# Patient Record
Sex: Female | Born: 1975 | Race: White | Hispanic: No | Smoking: Current every day smoker
Health system: Southern US, Community
[De-identification: ages and names within clinical notes are randomized; demographics above are authoritative.]

## PROBLEM LIST (undated history)

## (undated) DIAGNOSIS — I10 Essential (primary) hypertension: Secondary | ICD-10-CM

## (undated) DIAGNOSIS — G8929 Other chronic pain: Secondary | ICD-10-CM

## (undated) DIAGNOSIS — R519 Headache, unspecified: Secondary | ICD-10-CM

## (undated) DIAGNOSIS — M25569 Pain in unspecified knee: Secondary | ICD-10-CM

## (undated) DIAGNOSIS — R51 Headache: Secondary | ICD-10-CM

## (undated) DIAGNOSIS — J45909 Unspecified asthma, uncomplicated: Secondary | ICD-10-CM

---

## 2004-08-25 ENCOUNTER — Emergency Department: Payer: Self-pay | Admitting: General Practice

## 2004-12-28 ENCOUNTER — Emergency Department: Payer: Self-pay | Admitting: Emergency Medicine

## 2005-03-18 ENCOUNTER — Ambulatory Visit: Payer: Self-pay | Admitting: Nurse Practitioner

## 2005-06-26 ENCOUNTER — Emergency Department: Payer: Self-pay | Admitting: Emergency Medicine

## 2005-09-29 ENCOUNTER — Emergency Department: Payer: Self-pay | Admitting: Emergency Medicine

## 2007-01-31 ENCOUNTER — Emergency Department: Payer: Self-pay | Admitting: Emergency Medicine

## 2007-04-17 ENCOUNTER — Emergency Department: Payer: Self-pay | Admitting: Emergency Medicine

## 2007-09-03 ENCOUNTER — Emergency Department: Payer: Self-pay | Admitting: Internal Medicine

## 2007-11-04 ENCOUNTER — Emergency Department: Payer: Self-pay | Admitting: Emergency Medicine

## 2008-04-30 ENCOUNTER — Inpatient Hospital Stay: Payer: Self-pay | Admitting: Internal Medicine

## 2008-05-04 ENCOUNTER — Inpatient Hospital Stay: Payer: Self-pay | Admitting: Internal Medicine

## 2009-04-01 ENCOUNTER — Emergency Department: Payer: Self-pay | Admitting: Unknown Physician Specialty

## 2009-07-19 ENCOUNTER — Emergency Department: Payer: Self-pay | Admitting: Emergency Medicine

## 2009-10-11 ENCOUNTER — Emergency Department: Payer: Self-pay | Admitting: Internal Medicine

## 2010-01-22 ENCOUNTER — Emergency Department (HOSPITAL_COMMUNITY)
Admission: EM | Admit: 2010-01-22 | Discharge: 2010-01-23 | Payer: Self-pay | Source: Home / Self Care | Admitting: Emergency Medicine

## 2010-04-10 ENCOUNTER — Emergency Department (HOSPITAL_COMMUNITY)
Admission: EM | Admit: 2010-04-10 | Discharge: 2010-04-10 | Disposition: A | Discharge status: Home / Self Care | Payer: Self-pay | Attending: Emergency Medicine | Admitting: Emergency Medicine

## 2010-04-10 DIAGNOSIS — K219 Gastro-esophageal reflux disease without esophagitis: Secondary | ICD-10-CM | POA: Insufficient documentation

## 2010-04-10 DIAGNOSIS — N61 Mastitis without abscess: Secondary | ICD-10-CM | POA: Insufficient documentation

## 2010-04-20 LAB — URINALYSIS, ROUTINE W REFLEX MICROSCOPIC
Bilirubin Urine: NEGATIVE
Glucose, UA: NEGATIVE mg/dL
Ketones, ur: NEGATIVE mg/dL
Nitrite: NEGATIVE
Specific Gravity, Urine: 1.005 (ref 1.005–1.030)
pH: 5.5 (ref 5.0–8.0)

## 2010-10-29 ENCOUNTER — Emergency Department (HOSPITAL_COMMUNITY)
Admission: EM | Admit: 2010-10-29 | Discharge: 2010-10-29 | Disposition: A | Discharge status: Home / Self Care | Payer: Self-pay | Attending: Emergency Medicine | Admitting: Emergency Medicine

## 2010-10-29 DIAGNOSIS — M546 Pain in thoracic spine: Secondary | ICD-10-CM | POA: Insufficient documentation

## 2010-10-29 DIAGNOSIS — M545 Low back pain, unspecified: Secondary | ICD-10-CM | POA: Insufficient documentation

## 2010-10-29 DIAGNOSIS — K219 Gastro-esophageal reflux disease without esophagitis: Secondary | ICD-10-CM | POA: Insufficient documentation

## 2011-08-22 ENCOUNTER — Emergency Department: Payer: Self-pay | Admitting: Emergency Medicine

## 2012-06-03 ENCOUNTER — Emergency Department: Payer: Self-pay | Admitting: Emergency Medicine

## 2013-02-20 ENCOUNTER — Emergency Department: Payer: Self-pay | Admitting: Emergency Medicine

## 2013-04-06 ENCOUNTER — Emergency Department: Payer: Self-pay | Admitting: Emergency Medicine

## 2013-09-21 ENCOUNTER — Ambulatory Visit: Payer: Self-pay | Admitting: Physician Assistant

## 2013-11-16 ENCOUNTER — Emergency Department: Payer: Self-pay | Admitting: Emergency Medicine

## 2013-11-16 LAB — CBC WITH DIFFERENTIAL/PLATELET
BASOS PCT: 0.2 %
Basophil #: 0 10*3/uL (ref 0.0–0.1)
EOS PCT: 0 %
Eosinophil #: 0 10*3/uL (ref 0.0–0.7)
HCT: 39.1 % (ref 35.0–47.0)
HGB: 12.4 g/dL (ref 12.0–16.0)
LYMPHS PCT: 13.6 %
Lymphocyte #: 1.4 10*3/uL (ref 1.0–3.6)
MCH: 24.9 pg — ABNORMAL LOW (ref 26.0–34.0)
MCHC: 31.7 g/dL — AB (ref 32.0–36.0)
MCV: 79 fL — AB (ref 80–100)
MONO ABS: 0.7 x10 3/mm (ref 0.2–0.9)
Monocyte %: 6.6 %
Neutrophil #: 8.4 10*3/uL — ABNORMAL HIGH (ref 1.4–6.5)
Neutrophil %: 79.6 %
Platelet: 271 10*3/uL (ref 150–440)
RBC: 4.98 10*6/uL (ref 3.80–5.20)
RDW: 17.1 % — AB (ref 11.5–14.5)
WBC: 10.6 10*3/uL (ref 3.6–11.0)

## 2013-11-16 LAB — BASIC METABOLIC PANEL
Anion Gap: 6 — ABNORMAL LOW (ref 7–16)
BUN: 9 mg/dL (ref 7–18)
CHLORIDE: 105 mmol/L (ref 98–107)
Calcium, Total: 8.6 mg/dL (ref 8.5–10.1)
Co2: 29 mmol/L (ref 21–32)
Creatinine: 0.65 mg/dL (ref 0.60–1.30)
EGFR (Non-African Amer.): >60
Glucose: 106 mg/dL — ABNORMAL HIGH (ref 65–99)
Osmolality: 279 (ref 275–301)
Potassium: 3.8 mmol/L (ref 3.5–5.1)
Sodium: 140 mmol/L (ref 136–145)

## 2013-11-16 LAB — URINALYSIS, COMPLETE
BACTERIA: NONE SEEN
BLOOD: NEGATIVE
Bilirubin,UR: NEGATIVE
Glucose,UR: NEGATIVE mg/dL (ref 0–75)
Ketone: NEGATIVE
LEUKOCYTE ESTERASE: NEGATIVE
Nitrite: NEGATIVE
PH: 6 (ref 4.5–8.0)
PROTEIN: NEGATIVE
RBC,UR: <1 /HPF (ref 0–5)
SPECIFIC GRAVITY: 1.005 (ref 1.003–1.030)
WBC UR: 1 /HPF (ref 0–5)

## 2013-11-16 LAB — TROPONIN I: Troponin-I: <0.02 ng/mL

## 2014-01-25 ENCOUNTER — Emergency Department (HOSPITAL_COMMUNITY)
Admission: EM | Admit: 2014-01-25 | Discharge: 2014-01-25 | Disposition: A | Discharge status: Home / Self Care | Payer: Medicaid Other | Attending: Emergency Medicine | Admitting: Emergency Medicine

## 2014-01-25 ENCOUNTER — Encounter (HOSPITAL_COMMUNITY): Payer: Self-pay | Admitting: Cardiology

## 2014-01-25 DIAGNOSIS — Z880 Allergy status to penicillin: Secondary | ICD-10-CM | POA: Diagnosis not present

## 2014-01-25 DIAGNOSIS — I10 Essential (primary) hypertension: Secondary | ICD-10-CM | POA: Insufficient documentation

## 2014-01-25 DIAGNOSIS — R51 Headache: Secondary | ICD-10-CM | POA: Insufficient documentation

## 2014-01-25 DIAGNOSIS — R112 Nausea with vomiting, unspecified: Secondary | ICD-10-CM | POA: Insufficient documentation

## 2014-01-25 DIAGNOSIS — J45909 Unspecified asthma, uncomplicated: Secondary | ICD-10-CM | POA: Diagnosis not present

## 2014-01-25 DIAGNOSIS — Z79899 Other long term (current) drug therapy: Secondary | ICD-10-CM | POA: Diagnosis not present

## 2014-01-25 DIAGNOSIS — Z791 Long term (current) use of non-steroidal anti-inflammatories (NSAID): Secondary | ICD-10-CM | POA: Diagnosis not present

## 2014-01-25 DIAGNOSIS — R519 Headache, unspecified: Secondary | ICD-10-CM

## 2014-01-25 DIAGNOSIS — Z720 Tobacco use: Secondary | ICD-10-CM | POA: Insufficient documentation

## 2014-01-25 HISTORY — DX: Pain in unspecified knee: M25.569

## 2014-01-25 HISTORY — DX: Headache, unspecified: R51.9

## 2014-01-25 HISTORY — DX: Essential (primary) hypertension: I10

## 2014-01-25 HISTORY — DX: Other chronic pain: G89.29

## 2014-01-25 HISTORY — DX: Unspecified asthma, uncomplicated: J45.909

## 2014-01-25 HISTORY — DX: Headache: R51

## 2014-01-25 MED ORDER — PROMETHAZINE HCL 25 MG/ML IJ SOLN
25.0000 mg | Freq: Once | INTRAMUSCULAR | Status: AC
  Administered 2014-01-25: 25 mg via INTRAMUSCULAR
  Filled 2014-01-25: qty 1

## 2014-01-25 MED ORDER — KETOROLAC TROMETHAMINE 60 MG/2ML IM SOLN
60.0000 mg | Freq: Once | INTRAMUSCULAR | Status: AC
  Administered 2014-01-25: 60 mg via INTRAMUSCULAR
  Filled 2014-01-25: qty 2

## 2014-01-25 MED ORDER — PROMETHAZINE HCL 25 MG PO TABS: 25.0000 mg | ORAL_TABLET | Freq: Four times a day (QID) | ORAL | Status: DC | PRN

## 2014-01-25 MED ORDER — DIPHENHYDRAMINE HCL 50 MG/ML IJ SOLN
50.0000 mg | Freq: Once | INTRAMUSCULAR | Status: AC
  Administered 2014-01-25: 50 mg via INTRAMUSCULAR
  Filled 2014-01-25: qty 1

## 2014-01-25 NOTE — ED Notes (Signed)
Pt alert & oriented x4, stable gait. Patient given discharge instructions, paperwork & prescription(s). Patient  instructed to stop at the registration desk to finish any additional paperwork. Patient verbalized understanding. Pt left department w/ no further questions. 

## 2014-01-25 NOTE — ED Notes (Signed)
Headache and vomiting since yesterday

## 2014-01-25 NOTE — Discharge Instructions (Signed)
°Emergency Department Resource Guide °1) Find a Doctor and Pay Out of Pocket °Although you won't have to find out who is covered by your insurance plan, it is a good idea to ask around and get recommendations. You will then need to call the office and see if the doctor you have chosen will accept you as a new patient and what types of options they offer for patients who are self-pay. Some doctors offer discounts or will set up payment plans for their patients who do not have insurance, but you will need to ask so you aren't surprised when you get to your appointment. ° °2) Contact Your Local Health Department °Not all health departments have doctors that can see patients for sick visits, but many do, so it is worth a call to see if yours does. If you don't know where your local health department is, you can check in your phone book. The CDC also has a tool to help you locate your state's health department, and many state websites also have listings of all of their local health departments. ° °3) Find a Walk-in Clinic °If your illness is not likely to be very severe or complicated, you may want to try a walk in clinic. These are popping up all over the country in pharmacies, drugstores, and shopping centers. They're usually staffed by nurse practitioners or physician assistants that have been trained to treat common illnesses and complaints. They're usually fairly quick and inexpensive. However, if you have serious medical issues or chronic medical problems, these are probably not your best option. ° °No Primary Care Doctor: °- Call [facility] at  832-8000 - they can help you locate a primary care doctor that  accepts your insurance, provides certain services, etc. °- Physician Referral Service- 1-800-533-3463 ° °Chronic Pain Problems: °Organization         Address  Phone   Notes  °[facility]  (336) 297-2271 Patients need to be referred by their primary care doctor.  ° °Medication  Assistance: °Organization         Address  Phone   Notes  °[place] [facility] [address] °[place], [place] [address] (336) 641-8030 --Must be a resident of [place] °-- Must have NO insurance coverage whatsoever (no Medicaid/ Medicare, etc.) °-- The pt. MUST have a primary care doctor that directs their care regularly and follows them in the community °  °[facility]  (866) 331-1348   °[employer]  (888) 892-1162   ° °Agencies that provide inexpensive medical care: °Organization         Address  Phone   Notes  °[facility]  (336) 832-8035   °[facility]    (336) 832-7272   °[facility] [address] °[place], [place] [address] (336) 832-4777   °[facility] [address], °[place] (336) 271-4999   °[facility]    (336) 373-0678   °[facility]    (336) 272-1050   °[facility] ° [address], [place] Phone:  (336) 832-4444, Fax:  (336) 832-4440 Hours of Operation:  9 am - 6 pm, M-F.  Also accepts Medicaid/Medicare and self-pay.  °[facility] ° [address], [place] Phone: (336) 832-3150, Fax: (336) 832-3151. Hours of Operation:  8:30 am - 5:30 pm, M-F.  Also accepts Medicaid and self-pay.  °[facility] [place] [address], [place] Phone: (336) 878-6027   °[facility] [address], [place], [place] (336)723-1848, Ext. 123 Mondays & Thursdays: 7-9 AM.  First 15 patients are seen on a first come, first serve basis. °  ° °Medicaid-accepting [place] Providers: ° °Organization         Address  Phone   Notes  °[facility] [address], [place] (336) 641-2100 Also accepts self-pay patients.  °[facility] [address], [place] ° (336) 856-9996   °[facility] [address], [place]  (336) 288-8857   °[facility] [address] [place] [address], [place] (336) 299-7000   °Veita Bland [address], [place]  ° (336) 373-1557 Only accepts [place] Access Medicaid patients after they have their name applied to their card.  ° °Self-Pay (no insurance) in [place]: ° °Organization         Address  Phone   Notes  °Sickle Cell Patients, [facility] [address], [place] (336) 832-1970   °[facility] [address], [place] (336) 832-4400   °[facility] ° [address], [place] (336) 992-4800   °[facility]/Dr. Osei-Bonsu ° [address] [place] [address], [place] or [address], [place] (336) 841-8500 Phone number for both [place] and [place] locations is the same.  °[facility] [address], [place] (336) 299-0000   °[facility] [address] [place] [address], [place] or [address] (336) 852-7530 °(336) 878-2260   °[facility] [address], [place] (336) 350-1642, phone; (336) 294-5005, fax Sees patients 1st and 3rd Saturday of every month.  Must not qualify for public or private insurance (i.e. Medicaid, Medicare, [place] Health Choice, Veterans' Benefits) • Household income should be no more than 200% of the poverty level •The clinic cannot treat you if you are pregnant or think you are pregnant • Sexually transmitted diseases are not treated at the clinic.  ° ° °Dental Care: °Organization         Address  Phone  Notes  °[place] [facility] [address], [place] (336) 641-6152 Accepts children up to age 21 who are enrolled in Medicaid or [place] Health Choice; pregnant women with a Medicaid card; and children who have applied for Medicaid or [place] Health Choice, but were declined, whose parents can pay a reduced fee at time of service.  °[place]  [facility] [place]  [address], [place] (336) 641-7733 Accepts children up to age 21 who are enrolled in Medicaid or [place] Health Choice; pregnant women with a Medicaid card; and children who have applied for Medicaid or [place] Health Choice, but were declined, whose parents can pay a reduced fee at time of service.  °[facility] ° [address], [place] (336) 641-4533 Patients are seen by appointment only. Walk-ins are not accepted. [facility] will see patients 18 years of age and older. °Monday - Tuesday (8am-5pm) °Most Wednesdays (8:30-5pm) °$30 per visit, cash only  °[facility] ° [address], [place] (336) 641-4533 Patients are seen by appointment only. Walk-ins are not accepted. [facility] will see patients 18 years of age and older. °One   Wednesday Evening (Monthly: Volunteer Based).  $30 per visit, cash only  °[facility]  (919) 537-3737 for adults; Children under age 4, call Graduate Pediatric Dentistry at ([phone]) [phone]. Children aged 4-14, please call (919) [phone] to request a pediatric application. ° Dental services are provided in all areas of dental care including fillings, crowns and bridges, complete and partial dentures, implants, gum treatment, root canals, and extractions. Preventive care is also provided. Treatment is provided to both adults and children. °Patients are selected via a lottery and there is often a waiting list. °  °[facility] [address], °[place] ° (336) 763-8833 [email or web address] °  °[facility] [address], [place], [place] (336)723-1848, Ext. 123 Second and Fourth Thursday of each month, opens at 6:30 AM; Clinic ends at 9 AM.  Patients are seen on a first-come first-served basis, and a limited number are seen during each clinic.  ° °[facility] ° [address], [place], [place] (336) 723-7904    Eligibility Requirements °You must have lived in [place], [place], or [place] counties for at least the last three months. °  You cannot be eligible for state or federal sponsored healthcare insurance, including Veterans Administration, Medicaid, or Medicare. °  You generally cannot be eligible for healthcare insurance through your employer.  °  How to apply: °Eligibility screenings are held every Tuesday and Wednesday afternoon from 1:00 pm until 4:00 pm. You do not need an appointment for the interview!  °[facility] [address], [place], [place] 336-631-2330   °[facility]  336-342-8273   °[facility]  336-703-3100   °[facility]  336-570-6415   ° °Behavioral Health Resources in the Community: °Intensive Outpatient Programs °Organization         Address  Phone  Notes  °[facility] [address], [place], [place] 336-878-6098   °[facility] [address], [place], [place] 336-832-9800   °[facility] [address], [place], [place] ° 336-882-2125   °[facility] [address],  °[place], [place] 1-800-853-5163 or 336-641-4981   °Substance Abuse Resources °Organization         Address  Phone  Notes  °[facility]  336-882-2125   °[facility]  336-784-9470   °[facility]  336-285-9073   °[facility]  336-845-3988   °Residential & Outpatient Substance Abuse Program  1-800-659-3381   °Psychological Services °Organization         Address  Phone  Notes  °[facility]  336- 832-9600   °[facility]  336- 378-7881   °[facility] [address], [place] 1-800-853-5163 or 336-641-4981   ° °Mobile Crisis Teams °Organization         Address  Phone  Notes  °[facility], Mobile Crisis Care Unit  1-877-626-1772   °Assertive °[facility] ° [address]  [place], [place] 336-834-9664   °Sharon DeEsch [address] °[place] 336-554-5454   ° °Self-Help/Support Groups °Organization         Address  Phone             Notes  °[facility]  - variety of support groups  336- 373-1402 Call for more information  °Narcotics Anonymous (NA), [facility] [address], °[place]  2 meetings at this location  ° °  Residential Treatment Programs Organization         Address  Phone  Notes  [facility] [address],    [place]  [phone]   [facility]  [address], [place] [phone], [place], [place] [phone]) [phone]   [facility] [address], [place] [phone] Admissions: 8am-3pm M-F  [facility] [address]    [place], [place] [phone]   [facility] [address], [place], [place] [phone])[phone]   [facility] [address]  [place], [place] [phone]   [facility] Intensive Outpatient [address], [place], [place] [phone]   [facility] [address]  [place], [place] [phone]) [phone] or (718)[phone](480)[phone]   [facility] [address] [place], [place] [phone] Accepts Medicaid  [facility] [address]  [place] [phone] Substance Abuse/Addiction Treatment   [facility]         Address  Phone  Notes  [facility]  (410)577-8878(888) (732) [phone]   Angie FavaJulie Brannon, PhD [address], [address] [place], [place]   ([phone](336) [phone] or (516)[phone](336) ([phone]   [facility]   [address] [place], [place] [phone](336) [phone]   [facility] [address], [place], [place] ([phone](336) ([phone]) [phone] Insurance/Medicaid/sponsorship through [facility]  [facility] [address]                                    [place], [place] [phone](336) ([phone] Therapy/tele-psych/case    [facility] [address].   [place], [place] (765) [phone](336) [phone]    Dr. Lolly MustacheArfeen  [phone](336) [phone]   [facility]  [employer] [facility] 1) [address], [place] 2) [address], [place] 3)  [address], [place] [phone](336) [phone](336) [phone]  [phone](336) [phone]   [facility] Child Abuse Hotline 720-188-4860(336) (541) 078-6064 or [phone](336) [phone] (After Hours)      Take over the counter tylenol, ibuprofen and benadryl, as directed on packaging, with the prescription given to you today, as needed for headache.  Keep a headache diary, as discussed.  Call your regular medical doctor today to schedule a follow up appointment within the next 2 days.  Return to the Emergency Department immediately sooner if worsening.

## 2014-01-25 NOTE — ED Provider Notes (Signed)
CSN: [id number]     Arrival date & time 01/25/14  1236 History   First MD Initiated Contact with Patient 01/25/14 1309     Chief Complaint  Patient presents with  . Headache      HPI Pt was seen at 1315. Per pt, c/o gradual onset and persistence of constant acute flair of her chronic migraine headache for the past 2 days.Has been associated with nasal/sinus congestion and several episodes of N/V.   Describes the headache as per her usual chronic migraine headache pain pattern for several years.  Denies headache was sudden or maximal in onset or at any time.  Denies visual changes, no focal motor weakness, no tingling/numbness in extremities, no fevers, no neck pain, no rash.     Past Medical History  Diagnosis Date  . Asthma   . Hypertension   . Chronic headache   . Chronic knee pain    Past Surgical History  Procedure Laterality Date  . Cesarean section      History  Substance Use Topics  . Smoking status: Current Every Day Smoker  . Smokeless tobacco: Not on file  . Alcohol Use: No    Review of Systems ROS: Statement: All systems negative except as marked or noted in the HPI; Constitutional: Negative for fever and chills. ; ; Eyes: Negative for eye pain, redness and discharge. ; ; ENMT: Negative for ear pain, hoarseness, sore throat. +nasal congestion, sinus pressure. ; ; Cardiovascular: Negative for chest pain, palpitations, diaphoresis, dyspnea and peripheral edema. ; ; Respiratory: Negative for cough, wheezing and stridor. ; ; Gastrointestinal: +N/V. Negative for diarrhea, abdominal pain, blood in stool, hematemesis, jaundice and rectal bleeding. . ; ; Genitourinary: Negative for dysuria, flank pain and hematuria. ; ; Musculoskeletal: Negative for back pain and neck pain. Negative for swelling and trauma.; ; Skin: Negative for pruritus, rash, abrasions, blisters, bruising and skin lesion.; ; Neuro: +headache. Negative for lightheadedness and neck stiffness. Negative for  weakness, altered level of consciousness , altered mental status, extremity weakness, paresthesias, involuntary movement, seizure and syncope.     Allergies  Penicillins  Home Medications   Prior to Admission medications   Medication Sig Start Date End Date Taking? Authorizing Provider  citalopram (CELEXA) 10 MG tablet Take 10 mg by mouth daily.   Yes Historical Provider, MD  ferrous sulfate 325 (65 FE) MG tablet Take 325 mg by mouth daily.   Yes Historical Provider, MD  meloxicam (MOBIC) 7.5 MG tablet Take 7.5 mg by mouth daily.   Yes Historical Provider, MD  omeprazole (PRILOSEC) 20 MG capsule Take 20 mg by mouth daily.   Yes Historical Provider, MD  traMADol (ULTRAM) 50 MG tablet Take 50 mg by mouth every 6 (six) hours as needed for moderate pain.    Yes Historical Provider, MD   BP 151/100 mmHg  Pulse 90  Temp(Src) 98.1 F (36.7 C) (Oral)  Resp 18  Ht 5\' 3"  (1.6 m)  Wt 250 lb (113.399 kg)  BMI 44.30 kg/m2  SpO2 100%  LMP 01/05/2014 Physical Exam  1320: Physical examination:  Nursing notes reviewed; Vital signs and O2 SAT reviewed;  Constitutional: Well developed, Well nourished, Well hydrated, In no acute distress; Head:  Normocephalic, atraumatic; Eyes: EOMI, PERRL, No scleral icterus; ENMT: TM's clear bilat. +edemetous nasal turbinates bilat with clear rhinorrhea. +tender to percuss frontal and bilat maxillary sinuses. Mouth and pharynx without lesions. No tonsillar exudates. No intra-oral edema. No submandibular or sublingual edema. No hoarse voice, no  drooling, no stridor. No pain with manipulation of larynx. No trismus. Mouth and pharynx normal, Mucous membranes moist; Neck: Supple, Full range of motion, No lymphadenopathy; Cardiovascular: Regular rate and rhythm, No murmur, rub, or gallop; Respiratory: Breath sounds clear & equal bilaterally, No rales, rhonchi, wheezes.  Speaking full sentences with ease, Normal respiratory effort/excursion; Chest: Nontender, Movement normal;  Abdomen: Soft, Nontender, Nondistended, Normal bowel sounds; Genitourinary: No CVA tenderness; Extremities: Pulses normal, No tenderness, No edema, No calf edema or asymmetry.; Neuro: AA&Ox3, Major CN grossly intact.  Speech clear. No gross focal motor or sensory deficits in extremities.; Skin: Color normal, Warm, Dry.   ED Course  Procedures     MDM  MDM Reviewed: previous chart, nursing note and vitals      1520:  Pt feels better after meds and wants to go home now. Dx d/w pt and family.  Questions answered.  Verb understanding, agreeable to d/c home with outpt f/u.     Samuel JesterKathleen Taron Mondor, DO 01/28/14 1450

## 2014-01-25 NOTE — ED Notes (Signed)
Pt states she feels better and is ready for discharge

## 2014-07-10 ENCOUNTER — Emergency Department: Payer: Medicaid Other

## 2014-07-10 ENCOUNTER — Encounter: Payer: Self-pay | Admitting: Emergency Medicine

## 2014-07-10 ENCOUNTER — Emergency Department
Admission: EM | Admit: 2014-07-10 | Discharge: 2014-07-10 | Disposition: A | Discharge status: Home / Self Care | Payer: Medicaid Other | Attending: Emergency Medicine | Admitting: Emergency Medicine

## 2014-07-10 DIAGNOSIS — G43919 Migraine, unspecified, intractable, without status migrainosus: Secondary | ICD-10-CM

## 2014-07-10 DIAGNOSIS — Z720 Tobacco use: Secondary | ICD-10-CM | POA: Diagnosis not present

## 2014-07-10 DIAGNOSIS — Z880 Allergy status to penicillin: Secondary | ICD-10-CM | POA: Insufficient documentation

## 2014-07-10 DIAGNOSIS — Z792 Long term (current) use of antibiotics: Secondary | ICD-10-CM | POA: Diagnosis not present

## 2014-07-10 DIAGNOSIS — Z79899 Other long term (current) drug therapy: Secondary | ICD-10-CM | POA: Diagnosis not present

## 2014-07-10 DIAGNOSIS — R51 Headache: Secondary | ICD-10-CM | POA: Diagnosis present

## 2014-07-10 DIAGNOSIS — G43019 Migraine without aura, intractable, without status migrainosus: Secondary | ICD-10-CM | POA: Insufficient documentation

## 2014-07-10 DIAGNOSIS — I10 Essential (primary) hypertension: Secondary | ICD-10-CM | POA: Diagnosis not present

## 2014-07-10 MED ORDER — KETOROLAC TROMETHAMINE 30 MG/ML IJ SOLN
INTRAMUSCULAR | Status: AC
  Filled 2014-07-10: qty 1

## 2014-07-10 MED ORDER — DIPHENHYDRAMINE HCL 50 MG/ML IJ SOLN
25.0000 mg | Freq: Once | INTRAMUSCULAR | Status: AC
  Administered 2014-07-10: 25 mg via INTRAVENOUS

## 2014-07-10 MED ORDER — DIPHENHYDRAMINE HCL 50 MG/ML IJ SOLN
INTRAMUSCULAR | Status: AC
  Filled 2014-07-10: qty 1

## 2014-07-10 MED ORDER — METOCLOPRAMIDE HCL 5 MG/ML IJ SOLN
INTRAMUSCULAR | Status: AC
  Filled 2014-07-10: qty 2

## 2014-07-10 MED ORDER — SODIUM CHLORIDE 0.9 % IV BOLUS (SEPSIS)
1000.0000 mL | Freq: Once | INTRAVENOUS | Status: AC
  Administered 2014-07-10: 1000 mL via INTRAVENOUS

## 2014-07-10 MED ORDER — ISOMETHEPTENE-DICHLORAL-APAP 65-100-325 MG PO CAPS: 1.0000 | ORAL_CAPSULE | ORAL | Status: DC | PRN

## 2014-07-10 MED ORDER — KETOROLAC TROMETHAMINE 30 MG/ML IJ SOLN
30.0000 mg | Freq: Once | INTRAMUSCULAR | Status: AC
  Administered 2014-07-10: 30 mg via INTRAVENOUS

## 2014-07-10 MED ORDER — ONDANSETRON 4 MG PO TBDP
4.0000 mg | ORAL_TABLET | Freq: Once | ORAL | Status: AC
  Administered 2014-07-10: 4 mg via ORAL

## 2014-07-10 MED ORDER — HYDROMORPHONE HCL 1 MG/ML IJ SOLN
INTRAMUSCULAR | Status: AC
  Filled 2014-07-10: qty 1

## 2014-07-10 MED ORDER — HYDROMORPHONE HCL 1 MG/ML IJ SOLN
1.0000 mg | Freq: Once | INTRAMUSCULAR | Status: AC
  Administered 2014-07-10: 1 mg via INTRAVENOUS

## 2014-07-10 MED ORDER — METOCLOPRAMIDE HCL 5 MG/ML IJ SOLN
10.0000 mg | Freq: Once | INTRAMUSCULAR | Status: AC
  Administered 2014-07-10: 10 mg via INTRAVENOUS

## 2014-07-10 MED ORDER — ONDANSETRON 4 MG PO TBDP
ORAL_TABLET | ORAL | Status: AC
  Filled 2014-07-10: qty 1

## 2014-07-10 NOTE — ED Notes (Signed)
States she developed migraine last pm  No relief with home meds

## 2014-07-10 NOTE — ED Provider Notes (Signed)
[facility] Emergency Department Provider Note  ____________________________________________  Time seen: 1507  I have reviewed the triage vital signs and the nursing notes.   HISTORY  Chief Complaint Migraine   HPI Deanna Vazquez is a 39 y.o. female is here today with complaint of headaches. She states that this headache started yesterday. She is currently taking Topamax which she states is not helping she has been to her PCP and medication has been adjusted without relief of her headache. She states she has a headache every day. Her PCP was going to get a CT scan since she has not had one in a long time. Positive for nausea and vomiting. The last time being in the triage room. Patient is photophobic she states this time the pain is severe and everything is making her pain worse. None of her current medications are helping with her headache.At time of exam patient's pain is 9 out of 10.   Past Medical History  Diagnosis Date  . Asthma   . Hypertension   . Chronic headache   . Chronic knee pain     There are no active problems to display for this patient.   Past Surgical History  Procedure Laterality Date  . Cesarean section      Current Outpatient Rx  Name  Route  Sig  Dispense  Refill  . citalopram (CELEXA) 10 MG tablet   Oral   Take 10 mg by mouth daily.         . ferrous sulfate 325 (65 FE) MG tablet   Oral   Take 325 mg by mouth daily.         Marland Kitchen. isometheptene-acetaminophen-dichloralphenazone (MIDRIN) 65-100-325 MG capsule   Oral   Take 1 capsule by mouth every 4 (four) hours as needed for migraine. Maximum 5 capsules in 12 hours for migraine headaches, 8 capsules in 24 hours for tension headaches.   20 capsule   2   . meloxicam (MOBIC) 7.5 MG tablet   Oral   Take 7.5 mg by mouth daily.         Marland Kitchen. omeprazole (PRILOSEC) 20 MG capsule   Oral   Take 20 mg by mouth daily.         . promethazine (PHENERGAN) 25 MG tablet    Oral   Take 1 tablet (25 mg total) by mouth every 6 (six) hours as needed for nausea or vomiting (or headache).   8 tablet   0   . traMADol (ULTRAM) 50 MG tablet   Oral   Take 50 mg by mouth every 6 (six) hours as needed for moderate pain.            Allergies Penicillins  No family history on file.  Social History History  Substance Use Topics  . Smoking status: Current Every Day Smoker  . Smokeless tobacco: Not on file  . Alcohol Use: No    Review of Systems Constitutional: No fever/chills Eyes: Positive photophobia ENT: No sore throat. Cardiovascular: Denies chest pain. Respiratory: Denies shortness of breath. Gastrointestinal: No abdominal pain. Positive nausea, positive vomiting.  No diarrhea.  No constipation. Genitourinary: Negative for dysuria. Musculoskeletal: Negative for back pain. Skin: Negative for rash. Neurological: Positive for chronic migraine headaches, no focal weakness or numbness.  10-point ROS otherwise negative.  ____________________________________________   PHYSICAL EXAM:  VITAL SIGNS: ED Triage Vitals  Enc Vitals Group     BP 07/10/14 1426 180/110 mmHg     Pulse Rate 07/10/14  1426 96     Resp 07/10/14 1426 20     Temp 07/10/14 1426 98 F (36.7 C)     Temp Source 07/10/14 1426 Oral     SpO2 07/10/14 1426 98 %     Weight --      Height --      Head Cir --      Peak Flow --      Pain Score 07/10/14 1426 9     Pain Loc --      Pain Edu? --      Excl. in GC? --     Constitutional: Alert and oriented. Well appearing and in no acute distress. Patient crying Eyes: Conjunctivae are normal. PERRL. EOMI. photophobic  funduscopic not done due to patient's uncooperative Head: Atraumatic. Nose: No congestion/rhinnorhea. Neck: No stridor.  Supple Hematological/Lymphatic/Immunilogical: No cervical lymphadenopathy. Cardiovascular: Normal rate, regular rhythm. Grossly normal heart sounds.  Good peripheral circulation. Respiratory: Normal  respiratory effort.  No retractions. Lungs CTAB. Gastrointestinal: Soft and nontender. No distention. No abdominal bruits. No CVA tenderness. Musculoskeletal: No lower extremity tenderness nor edema.  No joint effusions. Reflexes are 1+ bilaterally lying down. Upper extremity range of motion without difficulty. Neurologic:  Normal speech and language. No gross focal neurologic deficits are appreciated. Speech is normal. No gait instability. Cranial nerves II through XII grossly intact Skin:  Skin is warm, dry and intact. No rash noted. Psychiatric: Mood and affect are normal. Speech and behavior are normal.  ____________________________________________   LABS (all labs ordered are listed, but only abnormal results are displayed)  Labs Reviewed  POC URINE PREG, ED   RADIOLOGY  CT scan of the head per radiologist negative ____________________________________________   PROCEDURES  Procedure(s) performed: None  Critical Care performed: No  ____________________________________________   INITIAL IMPRESSION / ASSESSMENT AND PLAN / ED COURSE  Pertinent labs & imaging results that were available during my care of the patient were reviewed by me and considered in my medical decision making (see chart for details).  Patient was completely headache free prior to leaving the emergency room. She is very frustrated with the fact that her Topamax is not helping prevent her headaches. She has been taking 1 pain pill every hour for severe headache per her family doctor within a results. She was given a prescription for Midrin to try for headaches and follow up with her PCP. ____________________________________________   FINAL CLINICAL IMPRESSION(S) / ED DIAGNOSES  Final diagnoses:  Intractable migraine, unspecified migraine type      Tommi Rumps, PA-C 07/10/14 1836

## 2014-07-10 NOTE — ED Notes (Signed)
Pt states that she has had chronic headaches for months. She states she has been treated with medications and oxygen. She states the pain this time is  severe and everything is making the pain worse . She also has nausea with the headache.

## 2014-07-10 NOTE — ED Notes (Signed)
PT states that she has

## 2014-07-10 NOTE — Discharge Instructions (Signed)

## 2014-09-03 ENCOUNTER — Emergency Department
Admission: EM | Admit: 2014-09-03 | Discharge: 2014-09-03 | Disposition: A | Discharge status: Home / Self Care | Payer: Medicaid Other | Attending: Emergency Medicine | Admitting: Emergency Medicine

## 2014-09-03 ENCOUNTER — Encounter: Payer: Self-pay | Admitting: Student

## 2014-09-03 DIAGNOSIS — Z720 Tobacco use: Secondary | ICD-10-CM | POA: Insufficient documentation

## 2014-09-03 DIAGNOSIS — R51 Headache: Secondary | ICD-10-CM | POA: Diagnosis present

## 2014-09-03 DIAGNOSIS — Z8669 Personal history of other diseases of the nervous system and sense organs: Secondary | ICD-10-CM

## 2014-09-03 DIAGNOSIS — Z791 Long term (current) use of non-steroidal anti-inflammatories (NSAID): Secondary | ICD-10-CM | POA: Diagnosis not present

## 2014-09-03 DIAGNOSIS — G43909 Migraine, unspecified, not intractable, without status migrainosus: Secondary | ICD-10-CM | POA: Diagnosis not present

## 2014-09-03 DIAGNOSIS — G43009 Migraine without aura, not intractable, without status migrainosus: Secondary | ICD-10-CM

## 2014-09-03 DIAGNOSIS — Z79899 Other long term (current) drug therapy: Secondary | ICD-10-CM | POA: Insufficient documentation

## 2014-09-03 DIAGNOSIS — I10 Essential (primary) hypertension: Secondary | ICD-10-CM | POA: Diagnosis not present

## 2014-09-03 DIAGNOSIS — Z880 Allergy status to penicillin: Secondary | ICD-10-CM | POA: Diagnosis not present

## 2014-09-03 MED ORDER — MEPERIDINE HCL 25 MG/ML IJ SOLN
25.0000 mg | Freq: Once | INTRAMUSCULAR | Status: AC
  Administered 2014-09-03: 25 mg via INTRAMUSCULAR
  Filled 2014-09-03: qty 1

## 2014-09-03 MED ORDER — PROMETHAZINE HCL 25 MG/ML IJ SOLN
12.5000 mg | Freq: Once | INTRAMUSCULAR | Status: AC
  Administered 2014-09-03: 12.5 mg via INTRAMUSCULAR
  Filled 2014-09-03: qty 1

## 2014-09-03 MED ORDER — BUTALBITAL-APAP-CAFFEINE 50-325-40 MG PO TABS: 1.0000 | ORAL_TABLET | Freq: Four times a day (QID) | ORAL | Status: AC | PRN

## 2014-09-03 MED ORDER — MEPERIDINE HCL 50 MG/ML IJ SOLN
50.0000 mg | Freq: Once | INTRAMUSCULAR | Status: AC
  Administered 2014-09-03: 50 mg via INTRAMUSCULAR
  Filled 2014-09-03: qty 1

## 2014-09-03 NOTE — ED Notes (Signed)
Pt reports headache started last night.  Reports history of migraines, takes Topamax and Effexor to treat.  Reports nausea, denies vomiting. Eyes sensitive to light.

## 2014-09-03 NOTE — Discharge Instructions (Signed)

## 2014-09-03 NOTE — ED Provider Notes (Signed)
[facility] Emergency Department Provider Note  ____________________________________________  Time seen: Approximately 7:02 PM  I have reviewed the triage vital signs and the nursing notes.   HISTORY  Chief Complaint Migraine   HPI Deanna Vazquez is a 39 y.o. female who presents for evaluation of migraine headache. Patient reports history of same and has had worse headaches. States this one started yesterday positive for nausea but denies any vomiting. Seen here about one month ago for the same.   Past Medical History  Diagnosis Date  . Asthma   . Hypertension   . Chronic headache   . Chronic knee pain     There are no active problems to display for this patient.   Past Surgical History  Procedure Laterality Date  . Cesarean section      Current Outpatient Rx  Name  Route  Sig  Dispense  Refill  . citalopram (CELEXA) 10 MG tablet   Oral   Take 10 mg by mouth daily.         . ferrous sulfate 325 (65 FE) MG tablet   Oral   Take 325 mg by mouth daily.         Marland Kitchen isometheptene-acetaminophen-dichloralphenazone (MIDRIN) 65-100-325 MG capsule   Oral   Take 1 capsule by mouth every 4 (four) hours as needed for migraine. Maximum 5 capsules in 12 hours for migraine headaches, 8 capsules in 24 hours for tension headaches.   20 capsule   2   . meloxicam (MOBIC) 7.5 MG tablet   Oral   Take 7.5 mg by mouth daily.         Marland Kitchen omeprazole (PRILOSEC) 20 MG capsule   Oral   Take 20 mg by mouth daily.         . promethazine (PHENERGAN) 25 MG tablet   Oral   Take 1 tablet (25 mg total) by mouth every 6 (six) hours as needed for nausea or vomiting (or headache).   8 tablet   0   . traMADol (ULTRAM) 50 MG tablet   Oral   Take 50 mg by mouth every 6 (six) hours as needed for moderate pain.            Allergies Penicillins  History reviewed. No pertinent family history.  Social History History  Substance Use Topics  . Smoking  status: Current Every Day Smoker -- 0.50 packs/day    Types: Cigarettes  . Smokeless tobacco: Never Used  . Alcohol Use: Yes     Comment: occasional    Review of Systems Constitutional: No fever/chills Eyes: No visual changes. ENT: No sore throat. Cardiovascular: Denies chest pain. Respiratory: Denies shortness of breath. Gastrointestinal: No abdominal pain.  Positive nausea, no vomiting.  No diarrhea.  No constipation. Genitourinary: Negative for dysuria. Musculoskeletal: Negative for back pain. Skin: Negative for rash. Neurological: Positive for headaches, negative for focal weakness or numbness.  10-point ROS otherwise negative.  ____________________________________________   PHYSICAL EXAM:  VITAL SIGNS: ED Triage Vitals  Enc Vitals Group     BP 09/03/14 1818 133/103 mmHg     Pulse Rate 09/03/14 1818 95     Resp 09/03/14 1818 18     Temp 09/03/14 1818 97.8 F (36.6 C)     Temp Source 09/03/14 1818 Oral     SpO2 09/03/14 1818 96 %     Weight 09/03/14 1818 239 lb (108.41 kg)     Height 09/03/14 1818 5\' 1"  (1.549 m)  Head Cir --      Peak Flow --      Pain Score 09/03/14 1819 10     Pain Loc --      Pain Edu? --      Excl. in GC? --     Constitutional: Alert and oriented. Well appearing and in no acute distress. Laying down in the dark with a cold washcloth over her eyes. Eyes: Conjunctivae are normal. PERRL. EOMI. Head: Atraumatic. Nose: No congestion/rhinnorhea. Mouth/Throat: Mucous membranes are moist.  Oropharynx non-erythematous. Neck: No stridor.   Cardiovascular: Normal rate, regular rhythm. Grossly normal heart sounds.  Good peripheral circulation. Respiratory: Normal respiratory effort.  No retractions. Lungs CTAB. Gastrointestinal: Soft and nontender. No distention. No abdominal bruits. No CVA tenderness. Musculoskeletal: No lower extremity tenderness nor edema.  No joint effusions. Neurologic:  Normal speech and language. No gross focal neurologic  deficits are appreciated. No gait instability. Skin:  Skin is warm, dry and intact. No rash noted. Psychiatric: Mood and affect are normal. Speech and behavior are normal.  ____________________________________________   LABS (all labs ordered are listed, but only abnormal results are displayed)  Labs Reviewed - No data to display ____________________________________________     PROCEDURES  Procedure(s) performed: None  Critical Care performed: No  ____________________________________________   INITIAL IMPRESSION / ASSESSMENT AND PLAN / ED COURSE  Pertinent labs & imaging results that were available during my care of the patient were reviewed by me and considered in my medical decision making (see chart for details).  Acute migraine headache, recurrent. Headache minimally relieved with Demerol and Phenergan while in the ER. Patient voices no other emergency medical complaints at this time and will return to the ER with any worsening symptomology. Patient to continue her current medications. Rx given for Fioricet one to 2 every 4-6 hours as needed for headache. ____________________________________________   FINAL CLINICAL IMPRESSION(S) / ED DIAGNOSES  Final diagnoses:  History of migraine headaches  Nonintractable migraine, unspecified migraine type      Evangeline Dakin, PA-C 09/03/14 2103  Sharman Cheek, MD 09/03/14 2211

## 2014-09-03 NOTE — ED Notes (Signed)
Patient with no complaints at this time. Respirations even and unlabored. Skin warm/dry. Discharge instructions reviewed with patient at this time. Patient given opportunity to voice concerns/ask questions. Patient discharged at this time and left Emergency Department with steady gait.   

## 2014-09-03 NOTE — ED Notes (Addendum)
PA spoke to pt at this time

## 2014-10-23 ENCOUNTER — Telehealth: Payer: Self-pay | Admitting: Podiatry

## 2014-10-23 NOTE — Telephone Encounter (Signed)
Pt. Left vm to schedule appt. No answer at number given

## 2014-10-28 ENCOUNTER — Emergency Department
Admission: EM | Admit: 2014-10-28 | Discharge: 2014-10-28 | Disposition: A | Discharge status: Home / Self Care | Payer: Medicaid Other | Attending: Emergency Medicine | Admitting: Emergency Medicine

## 2014-10-28 DIAGNOSIS — I10 Essential (primary) hypertension: Secondary | ICD-10-CM | POA: Diagnosis not present

## 2014-10-28 DIAGNOSIS — Z720 Tobacco use: Secondary | ICD-10-CM | POA: Diagnosis not present

## 2014-10-28 DIAGNOSIS — M79672 Pain in left foot: Secondary | ICD-10-CM | POA: Diagnosis present

## 2014-10-28 DIAGNOSIS — M722 Plantar fascial fibromatosis: Secondary | ICD-10-CM | POA: Diagnosis not present

## 2014-10-28 DIAGNOSIS — Z880 Allergy status to penicillin: Secondary | ICD-10-CM | POA: Diagnosis not present

## 2014-10-28 DIAGNOSIS — Z79899 Other long term (current) drug therapy: Secondary | ICD-10-CM | POA: Diagnosis not present

## 2014-10-28 DIAGNOSIS — G8929 Other chronic pain: Secondary | ICD-10-CM | POA: Insufficient documentation

## 2014-10-28 MED ORDER — HYDROCODONE-ACETAMINOPHEN 5-325 MG PO TABS: 1.0000 | ORAL_TABLET | ORAL | Status: DC | PRN

## 2014-10-28 MED ORDER — MELOXICAM 15 MG PO TABS: 15.0000 mg | ORAL_TABLET | Freq: Every day | ORAL | Status: DC

## 2014-10-28 MED ORDER — PREDNISONE 10 MG (21) PO TBPK: ORAL_TABLET | ORAL | Status: DC

## 2014-10-28 NOTE — Discharge Instructions (Signed)
You may try and wear night splints that can be purchased at the pharmacy. That will help with the pain in the morning.  Plantar Fasciitis Plantar fasciitis is a common condition that causes foot pain. It is soreness (inflammation) of the band of tough fibrous tissue on the bottom of the foot that runs from the heel bone (calcaneus) to the ball of the foot. The cause of this soreness may be from excessive standing, poor fitting shoes, running on hard surfaces, being overweight, having an abnormal walk, or overuse (this is common in runners) of the painful foot or feet. It is also common in aerobic exercise dancers and ballet dancers. SYMPTOMS  Most people with plantar fasciitis complain of:  Severe pain in the morning on the bottom of their foot especially when taking the first steps out of bed. This pain recedes after a few minutes of walking.  Severe pain is experienced also during walking following a long period of inactivity.  Pain is worse when walking barefoot or up stairs DIAGNOSIS   Your caregiver will diagnose this condition by examining and feeling your foot.  Special tests such as X-rays of your foot, are usually not needed. PREVENTION   Consult a sports medicine professional before beginning a new exercise program.  Walking programs offer a good workout. With walking there is a lower chance of overuse injuries common to runners. There is less impact and less jarring of the joints.  Begin all new exercise programs slowly. If problems or pain develop, decrease the amount of time or distance until you are at a comfortable level.  Wear good shoes and replace them regularly.  Stretch your foot and the heel cords at the back of the ankle (Achilles tendon) both before and after exercise.  Run or exercise on even surfaces that are not hard. For example, asphalt is better than pavement.  Do not run barefoot on hard surfaces.  If using a treadmill, vary the incline.  Do not  continue to workout if you have foot or joint problems. Seek professional help if they do not improve. HOME CARE INSTRUCTIONS   Avoid activities that cause you pain until you recover.  Use ice or cold packs on the problem or painful areas after working out.  Only take over-the-counter or prescription medicines for pain, discomfort, or fever as directed by your caregiver.  Soft shoe inserts or athletic shoes with air or gel sole cushions may be helpful.  If problems continue or become more severe, consult a sports medicine caregiver or your own health care provider. Cortisone is a potent anti-inflammatory medication that may be injected into the painful area. You can discuss this treatment with your caregiver. MAKE SURE YOU:   Understand these instructions.  Will watch your condition.  Will get help right away if you are not doing well or get worse. Document Released: 10/20/2000 Document Revised: 04/19/2011 Document Reviewed: 12/20/2007 [facility] Patient Information 2015 [place], [place]. This information is not intended to replace advice given to you by your health care provider. Make sure you discuss any questions you have with your health care provider.

## 2014-10-28 NOTE — ED Notes (Signed)
Left heel pain for last two weeks. Very painful in the AM. Called podiatrist but needed a referral. Has history of same with both feet.

## 2014-10-28 NOTE — ED Provider Notes (Signed)
[facility] Emergency Department Provider Note  ____________________________________________  Time seen: Approximately 5:18 PM  I have reviewed the triage vital signs and the nursing notes.   HISTORY  Chief Complaint Foot Pain    HPI Deanna Vazquez is a 39 y.o. female who presents to the emergency department for evaluation of left heel pain. She reports a history of heel spurs. Several years ago she saw Dr. Al Corpus who gave her injections in the foot with relief. 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 The pain is severe in the morning and if she sits for any period of time and stands back up. She has been taking ibuprofen without relief.   Past Medical History  Diagnosis Date  . Asthma   . Hypertension   . Chronic headache   . Chronic knee pain     There are no active problems to display for this patient.   Past Surgical History  Procedure Laterality Date  . Cesarean section      Current Outpatient Rx  Name  Route  Sig  Dispense  Refill  . butalbital-acetaminophen-caffeine (FIORICET) 50-325-40 MG per tablet   Oral   Take 1-2 tablets by mouth every 6 (six) hours as needed for headache.   20 tablet   0   . citalopram (CELEXA) 10 MG tablet   Oral   Take 10 mg by mouth daily.         . ferrous sulfate 325 (65 FE) MG tablet   Oral   Take 325 mg by mouth daily.         Marland Kitchen HYDROcodone-acetaminophen (NORCO/VICODIN) 5-325 MG per tablet   Oral   Take 1 tablet by mouth every 4 (four) hours as needed.   12 tablet   0   . isometheptene-acetaminophen-dichloralphenazone (MIDRIN) 65-100-325 MG capsule   Oral   Take 1 capsule by mouth every 4 (four) hours as needed for migraine. Maximum 5 capsules in 12 hours for migraine headaches, 8 capsules in 24 hours for tension headaches.   20 capsule   2   . meloxicam (MOBIC) 15  MG tablet   Oral   Take 1 tablet (15 mg total) by mouth daily.   30 tablet   2   . omeprazole (PRILOSEC) 20 MG capsule   Oral   Take 20 mg by mouth daily.         . predniSONE (STERAPRED UNI-PAK 21 TAB) 10 MG (21) TBPK tablet      Take 6 tablets on day 1 Take 5 tablets on day 2 Take 4 tablets on day 3 Take 3 tablets on day 4 Take 2 tablets on day 5 Take 1 tablet on day 6   21 tablet   0   . promethazine (PHENERGAN) 25 MG tablet   Oral   Take 1 tablet (25 mg total) by mouth every 6 (six) hours as needed for nausea or vomiting (or headache).   8 tablet   0     Allergies Penicillins  No family history on file.  Social History Social History  Substance Use Topics  . Smoking status: Current Every Day Smoker -- 0.50 packs/day    Types: Cigarettes  . Smokeless tobacco: Never Used  . Alcohol Use: Yes     Comment: occasional    Review of Systems Constitutional: No recent  illness. Eyes: No visual changes. ENT: No sore throat. Cardiovascular: Denies chest pain or palpitations. Respiratory: Denies shortness of breath. Gastrointestinal: No abdominal pain.  Genitourinary: Negative for dysuria. Musculoskeletal: Pain in left heel. Skin: Negative for rash. Neurological: Negative for headaches, focal weakness or numbness. 10-point ROS otherwise negative.  ____________________________________________   PHYSICAL EXAM:  VITAL SIGNS: T: 97.5;  HR 77; Resp: 18; BP: 148/92; SpO2: 96% ED Triage Vitals  Enc Vitals Group     BP --      Pulse --      Resp --      Temp --      Temp src --      SpO2 --      Weight --      Height --      Head Cir --      Peak Flow --      Pain Score --      Pain Loc --      Pain Edu? --      Excl. in GC? --     Constitutional: Alert and oriented. Well appearing and in no acute distress. Eyes: Conjunctivae are normal. EOMI. Head: Atraumatic. Nose: No congestion/rhinnorhea. Neck: No stridor.  Respiratory: Normal respiratory  effort.   Musculoskeletal: Tenderness on the plantar surface of the left heel that extends to the mid foot. Neurologic:  Normal speech and language. No gross focal neurologic deficits are appreciated. Speech is normal. No gait instability. Skin:  Skin is warm, dry and intact. Atraumatic. Psychiatric: Mood and affect are normal. Speech and behavior are normal.  ____________________________________________   LABS (all labs ordered are listed, but only abnormal results are displayed)  Labs Reviewed - No data to display ____________________________________________  RADIOLOGY  Not indicated ____________________________________________   PROCEDURES  Procedure(s) performed: None   ____________________________________________   INITIAL IMPRESSION / ASSESSMENT AND PLAN / ED COURSE  Pertinent labs & imaging results that were available during my care of the patient were reviewed by me and considered in my medical decision making (see chart for details).  Exam consistent with plantar fasciitis. Patient was advised to follow up with podiatrist for symptoms that are not improving over the next week. She was advised to resume wearing supportive shoes. She was also advised to purchase night sweats at the pharmacy. She was advised to return to the emergency department for symptoms that change or worsen if unable to schedule an appointment with the primary care provider or specialist. ____________________________________________   FINAL CLINICAL IMPRESSION(S) / ED DIAGNOSES  Final diagnoses:  Plantar fasciitis, left       Chinita Pester, FNP 10/28/14 1822  Jennye Moccasin, MD 10/28/14 1932

## 2014-12-09 ENCOUNTER — Encounter: Payer: Self-pay | Admitting: Emergency Medicine

## 2014-12-09 ENCOUNTER — Emergency Department
Admission: EM | Admit: 2014-12-09 | Discharge: 2014-12-09 | Disposition: A | Discharge status: Home / Self Care | Payer: Medicaid Other | Attending: Emergency Medicine | Admitting: Emergency Medicine

## 2014-12-09 DIAGNOSIS — R51 Headache: Secondary | ICD-10-CM | POA: Diagnosis present

## 2014-12-09 DIAGNOSIS — G43009 Migraine without aura, not intractable, without status migrainosus: Secondary | ICD-10-CM | POA: Diagnosis not present

## 2014-12-09 DIAGNOSIS — Z880 Allergy status to penicillin: Secondary | ICD-10-CM | POA: Diagnosis not present

## 2014-12-09 DIAGNOSIS — I10 Essential (primary) hypertension: Secondary | ICD-10-CM | POA: Insufficient documentation

## 2014-12-09 DIAGNOSIS — Z7952 Long term (current) use of systemic steroids: Secondary | ICD-10-CM | POA: Insufficient documentation

## 2014-12-09 DIAGNOSIS — Z79899 Other long term (current) drug therapy: Secondary | ICD-10-CM | POA: Diagnosis not present

## 2014-12-09 DIAGNOSIS — Z720 Tobacco use: Secondary | ICD-10-CM | POA: Diagnosis not present

## 2014-12-09 DIAGNOSIS — Z791 Long term (current) use of non-steroidal anti-inflammatories (NSAID): Secondary | ICD-10-CM | POA: Insufficient documentation

## 2014-12-09 MED ORDER — SODIUM CHLORIDE 0.9 % IV BOLUS (SEPSIS)
1000.0000 mL | Freq: Once | INTRAVENOUS | Status: AC
  Administered 2014-12-09: 1000 mL via INTRAVENOUS

## 2014-12-09 MED ORDER — PROMETHAZINE HCL 25 MG/ML IJ SOLN
25.0000 mg | Freq: Once | INTRAMUSCULAR | Status: AC
  Administered 2014-12-09: 25 mg via INTRAVENOUS
  Filled 2014-12-09: qty 1

## 2014-12-09 MED ORDER — KETOROLAC TROMETHAMINE 30 MG/ML IJ SOLN
30.0000 mg | Freq: Once | INTRAMUSCULAR | Status: AC
  Administered 2014-12-09: 30 mg via INTRAVENOUS
  Filled 2014-12-09: qty 1

## 2014-12-09 MED ORDER — DIPHENHYDRAMINE HCL 50 MG/ML IJ SOLN
50.0000 mg | Freq: Once | INTRAMUSCULAR | Status: AC
  Administered 2014-12-09: 50 mg via INTRAVENOUS
  Filled 2014-12-09: qty 1

## 2014-12-09 NOTE — ED Provider Notes (Signed)
[facility] Emergency Department Provider Note  ____________________________________________  Time seen: Approximately 9:23 AM  I have reviewed the triage vital signs and the nursing notes.   HISTORY  Chief Complaint Headache    HPI Deanna Vazquez is a 39 y.o. female who presents to the emergency department complaining of a migraine headache. She states that she has a chronic history of same and is being followed by her primary care. She has an appointment in the future for a "headache specialist." She states that creases morning to include nausea/vomiting as well as photophobia and phonophobia. Patient states that she is tried her home medications with no relief. She denies any fevers or chills, neck pain, chest pain, shortness of breath.   Past Medical History  Diagnosis Date  . Asthma   . Hypertension   . Chronic headache   . Chronic knee pain     There are no active problems to display for this patient.   Past Surgical History  Procedure Laterality Date  . Cesarean section      Current Outpatient Rx  Name  Route  Sig  Dispense  Refill  . topiramate (TOPAMAX) 100 MG tablet   Oral   Take 100 mg by mouth 2 (two) times daily.         Marland Kitchen. venlafaxine (EFFEXOR) 37.5 MG tablet   Oral   Take 37.5 mg by mouth 2 (two) times daily.         . butalbital-acetaminophen-caffeine (FIORICET) 50-325-40 MG per tablet   Oral   Take 1-2 tablets by mouth every 6 (six) hours as needed for headache.   20 tablet   0   . citalopram (CELEXA) 10 MG tablet   Oral   Take 10 mg by mouth daily.         . ferrous sulfate 325 (65 FE) MG tablet   Oral   Take 325 mg by mouth daily.         Marland Kitchen. HYDROcodone-acetaminophen (NORCO/VICODIN) 5-325 MG per tablet   Oral   Take 1 tablet by mouth every 4 (four) hours as needed.   12 tablet   0   . isometheptene-acetaminophen-dichloralphenazone (MIDRIN) 65-100-325 MG capsule   Oral   Take 1 capsule by mouth every  4 (four) hours as needed for migraine. Maximum 5 capsules in 12 hours for migraine headaches, 8 capsules in 24 hours for tension headaches.   20 capsule   2   . meloxicam (MOBIC) 15 MG tablet   Oral   Take 1 tablet (15 mg total) by mouth daily.   30 tablet   2   . omeprazole (PRILOSEC) 20 MG capsule   Oral   Take 20 mg by mouth daily.         . predniSONE (STERAPRED UNI-PAK 21 TAB) 10 MG (21) TBPK tablet      Take 6 tablets on day 1 Take 5 tablets on day 2 Take 4 tablets on day 3 Take 3 tablets on day 4 Take 2 tablets on day 5 Take 1 tablet on day 6   21 tablet   0   . promethazine (PHENERGAN) 25 MG tablet   Oral   Take 1 tablet (25 mg total) by mouth every 6 (six) hours as needed for nausea or vomiting (or headache).   8 tablet   0     Allergies Penicillins  History reviewed. No pertinent family history.  Social History Social History  Substance Use Topics  .  Smoking status: Current Every Day Smoker -- 0.50 packs/day    Types: Cigarettes  . Smokeless tobacco: Never Used  . Alcohol Use: Yes     Comment: occasional    Review of Systems Constitutional: No fever/chills Eyes: No visual changes. ENT: No sore throat. Cardiovascular: Denies chest pain. Respiratory: Denies shortness of breath. Gastrointestinal: No abdominal pain.  No nausea, no vomiting.  No diarrhea.  No constipation. Genitourinary: Negative for dysuria. Musculoskeletal: Negative for back pain. Skin: Negative for rash. Neurological: Endorses headaches, but denies focal weakness or numbness. Endorses photophobia.  10-point ROS otherwise negative.  ____________________________________________   PHYSICAL EXAM:  VITAL SIGNS: ED Triage Vitals  Enc Vitals Group     BP 12/09/14 0830 160/96 mmHg     Pulse Rate 12/09/14 0830 90     Resp 12/09/14 0830 20     Temp 12/09/14 0830 98 F (36.7 C)     Temp Source 12/09/14 0830 Oral     SpO2 12/09/14 0830 97 %     Weight 12/09/14 0826 248 lb  (112.492 kg)     Height 12/09/14 0826  (1.575 m)     Head Cir --      Peak Flow --      Pain Score 12/09/14 0826 10     Pain Loc --      Pain Edu? --      Excl. in GC? --     Constitutional: Alert and oriented. Well appearing and in mild distress. Eyes: Conjunctivae are normal. PERRL. EOMI. Head: Atraumatic. Nose: No congestion/rhinnorhea. Mouth/Throat: Mucous membranes are moist.  Oropharynx non-erythematous. Neck: No stridor.   Cardiovascular: Normal rate, regular rhythm. Grossly normal heart sounds.  Good peripheral circulation. Respiratory: Normal respiratory effort.  No retractions. Lungs CTAB. Gastrointestinal: Soft and nontender. No distention. No abdominal bruits. No CVA tenderness. Musculoskeletal: No lower extremity tenderness nor edema.  No joint effusions. Neurologic:  Normal speech and language. No gross focal neurologic deficits are appreciated. No gait instability. Radial nerves II through XII grossly intact. Skin:  Skin is warm, dry and intact. No rash noted. Psychiatric: Mood and affect are normal. Speech and behavior are normal.  ____________________________________________   LABS (all labs ordered are listed, but only abnormal results are displayed)  Labs Reviewed - No data to display ____________________________________________  EKG   ____________________________________________  RADIOLOGY   ____________________________________________   PROCEDURES  Procedure(s) performed: None  Critical Care performed: No  ____________________________________________   INITIAL IMPRESSION / ASSESSMENT AND PLAN / ED COURSE  Pertinent labs & imaging results that were available during my care of the patient were reviewed by me and considered in my medical decision making (see chart for details).  Patient's history, symptoms, physical exam are consistent with known migraine headaches. Symptomatic control was provided with IV fluids, Toradol, Phenergan, and  diphenhydramine. The patient reports good benefit from medication. Patient states that headache has fully resolved 1 provider reinterviewed her. The patient will be sent home with no new medications. Patient verbalizes understanding of diagnosis and treatment plan. I advised the patient to keep her appointment with the specialist. ____________________________________________   FINAL CLINICAL IMPRESSION(S) / ED DIAGNOSES  Final diagnoses:  Migraine without aura and without status migrainosus, not intractable      Racheal Patches, PA-C 12/09/14 1053  Darien Ramus, MD 12/09/14 1544

## 2014-12-09 NOTE — ED Notes (Signed)
Started with headache on sat. States h/a conts this am. Positive nausea/vomiting and photophobia. Last time vomiting was about 4 am. Placed on stretcher with light out.. Family at bedside

## 2014-12-09 NOTE — ED Notes (Signed)
Pt to ed with c/o headache since Saturday.  Pt states she has tried excedrine without relief.

## 2014-12-09 NOTE — Discharge Instructions (Signed)
Migraine Headache °A migraine headache is an intense, throbbing pain on one or both sides of your head. A migraine can last for 30 minutes to several hours. °CAUSES  °The exact cause of a migraine headache is not always known. However, a migraine may be caused when nerves in the brain become irritated and release chemicals that cause inflammation. This causes pain. °Certain things may also trigger migraines, such as: °· Alcohol. °· Smoking. °· Stress. °· Menstruation. °· Aged cheeses. °· Foods or drinks that contain nitrates, glutamate, aspartame, or tyramine. °· Lack of sleep. °· Chocolate. °· Caffeine. °· Hunger. °· Physical exertion. °· Fatigue. °· Medicines used to treat chest pain (nitroglycerine), birth control pills, estrogen, and some blood pressure medicines. °SIGNS AND SYMPTOMS °· Pain on one or both sides of your head. °· Pulsating or throbbing pain. °· Severe pain that prevents daily activities. °· Pain that is aggravated by any physical activity. °· Nausea, vomiting, or both. °· Dizziness. °· Pain with exposure to bright lights, loud noises, or activity. °· General sensitivity to bright lights, loud noises, or smells. °Before you get a migraine, you may get warning signs that a migraine is coming (aura). An aura may include: °· Seeing flashing lights. °· Seeing bright spots, halos, or zigzag lines. °· Having tunnel vision or blurred vision. °· Having feelings of numbness or tingling. °· Having trouble talking. °· Having muscle weakness. °DIAGNOSIS  °A migraine headache is often diagnosed based on: °· Symptoms. °· Physical exam. °· A CT scan or MRI of your head. These imaging tests cannot diagnose migraines, but they can help rule out other causes of headaches. °TREATMENT °Medicines may be given for pain and nausea. Medicines can also be given to help prevent recurrent migraines.  °HOME CARE INSTRUCTIONS °· Only take over-the-counter or prescription medicines for pain or discomfort as directed by your  health care provider. The use of long-term narcotics is not recommended. °· Lie down in a dark, quiet room when you have a migraine. °· Keep a journal to find out what may trigger your migraine headaches. For example, write down: °¨ What you eat and drink. °¨ How much sleep you get. °¨ Any change to your diet or medicines. °· Limit alcohol consumption. °· Quit smoking if you smoke. °· Get 7-9 hours of sleep, or as recommended by your health care provider. °· Limit stress. °· Keep lights dim if bright lights bother you and make your migraines worse. °SEEK IMMEDIATE MEDICAL CARE IF:  °· Your migraine becomes severe. °· You have a fever. °· You have a stiff neck. °· You have vision loss. °· You have muscular weakness or loss of muscle control. °· You start losing your balance or have trouble walking. °· You feel faint or pass out. °· You have severe symptoms that are different from your first symptoms. °MAKE SURE YOU:  °· Understand these instructions. °· Will watch your condition. °· Will get help right away if you are not doing well or get worse. °  °This information is not intended to replace advice given to you by your health care provider. Make sure you discuss any questions you have with your health care provider. °  °Document Released: 01/25/2005 Document Revised: 02/15/2014 Document Reviewed: 10/02/2012 °Elsevier Interactive Patient Education ©2016 [employer] ° °Recurrent Migraine Headache °A migraine headache is an intense, throbbing pain on one or both sides of your head. Recurrent migraines keep coming back. A migraine can last for 30 minutes to several hours. °  CAUSES  °The exact cause of a migraine headache is not always known. However, a migraine may be caused when nerves in the brain become irritated and release chemicals that cause inflammation. This causes pain. °Certain things may also trigger migraines, such as:  °· Alcohol. °· Smoking. °· Stress. °· Menstruation. °· Aged cheeses. °· Foods or  drinks that contain nitrates, glutamate, aspartame, or tyramine. °· Lack of sleep. °· Chocolate. °· Caffeine. °· Hunger. °· Physical exertion. °· Fatigue. °· Medicines used to treat chest pain (nitroglycerine), birth control pills, estrogen, and some blood pressure medicines. °SYMPTOMS  °· Pain on one or both sides of your head. °· Pulsating or throbbing pain. °· Severe pain that prevents daily activities. °· Pain that is aggravated by any physical activity. °· Nausea, vomiting, or both. °· Dizziness. °· Pain with exposure to bright lights, loud noises, or activity. °· General sensitivity to bright lights, loud noises, or smells. °Before you get a migraine, you may get warning signs that a migraine is coming (aura). An aura may include: °· Seeing flashing lights. °· Seeing bright spots, halos, or zigzag lines. °· Having tunnel vision or blurred vision. °· Having feelings of numbness or tingling. °· Having trouble talking. °· Having muscle weakness. °DIAGNOSIS  °A recurrent migraine headache is often diagnosed based on: °· Symptoms. °· Physical examination. °· A CT scan or MRI of your head. These imaging tests cannot diagnose migraines but can help rule out other causes of headaches.   °TREATMENT  °Medicines may be given for pain and nausea. Medicines can also be given to help prevent recurrent migraines. °HOME CARE INSTRUCTIONS °· Only take over-the-counter or prescription medicines for pain or discomfort as directed by your health care provider. The use of long-term narcotics is not recommended. °· Lie down in a dark, quiet room when you have a migraine. °· Keep a journal to find out what may trigger your migraine headaches. For example, write down: °¨ What you eat and drink. °¨ How much sleep you get. °¨ Any change to your diet or medicines. °· Limit alcohol consumption. °· Quit smoking if you smoke. °· Get 7-9 hours of sleep, or as recommended by your health care provider. °· Limit stress. °· Keep lights dim if  bright lights bother you and make your migraines worse. °SEEK MEDICAL CARE IF:  °· You do not get relief from the medicines given to you. °· You have a recurrence of pain. °· You have a fever. °SEEK IMMEDIATE MEDICAL CARE IF: °· Your migraine becomes severe. °· You have a stiff neck. °· You have loss of vision. °· You have muscular weakness or loss of muscle control. °· You start losing your balance or have trouble walking. °· You feel faint or pass out. °· You have severe symptoms that are different from your first symptoms. °MAKE SURE YOU:  °· Understand these instructions. °· Will watch your condition. °· Will get help right away if you are not doing well or get worse. °  °This information is not intended to replace advice given to you by your health care provider. Make sure you discuss any questions you have with your health care provider. °  °Document Released: 10/20/2000 Document Revised: 02/15/2014 Document Reviewed: 10/02/2012 °Elsevier Interactive Patient Education ©2016 [employer] ° °

## 2015-07-27 ENCOUNTER — Encounter: Payer: Self-pay | Admitting: Emergency Medicine

## 2015-07-27 ENCOUNTER — Emergency Department
Admission: EM | Admit: 2015-07-27 | Discharge: 2015-07-27 | Disposition: A | Discharge status: Home / Self Care | Payer: Medicaid Other | Attending: Emergency Medicine | Admitting: Emergency Medicine

## 2015-07-27 DIAGNOSIS — Z79899 Other long term (current) drug therapy: Secondary | ICD-10-CM | POA: Diagnosis not present

## 2015-07-27 DIAGNOSIS — J45909 Unspecified asthma, uncomplicated: Secondary | ICD-10-CM | POA: Diagnosis not present

## 2015-07-27 DIAGNOSIS — I10 Essential (primary) hypertension: Secondary | ICD-10-CM | POA: Diagnosis not present

## 2015-07-27 DIAGNOSIS — G43009 Migraine without aura, not intractable, without status migrainosus: Secondary | ICD-10-CM | POA: Diagnosis not present

## 2015-07-27 DIAGNOSIS — F17210 Nicotine dependence, cigarettes, uncomplicated: Secondary | ICD-10-CM | POA: Insufficient documentation

## 2015-07-27 MED ORDER — PROCHLORPERAZINE EDISYLATE 5 MG/ML IJ SOLN
10.0000 mg | Freq: Once | INTRAMUSCULAR | Status: AC
  Administered 2015-07-27: 10 mg via INTRAMUSCULAR
  Filled 2015-07-27: qty 2

## 2015-07-27 MED ORDER — OXYCODONE-ACETAMINOPHEN 5-325 MG PO TABS
1.0000 | ORAL_TABLET | ORAL | Status: DC | PRN
  Administered 2015-07-27: 1 via ORAL

## 2015-07-27 MED ORDER — OXYCODONE-ACETAMINOPHEN 5-325 MG PO TABS
ORAL_TABLET | ORAL | Status: AC
  Filled 2015-07-27: qty 1

## 2015-07-27 MED ORDER — DIPHENHYDRAMINE HCL 50 MG/ML IJ SOLN
50.0000 mg | Freq: Once | INTRAMUSCULAR | Status: AC
  Administered 2015-07-27: 50 mg via INTRAMUSCULAR
  Filled 2015-07-27: qty 1

## 2015-07-27 MED ORDER — KETOROLAC TROMETHAMINE 60 MG/2ML IM SOLN
60.0000 mg | Freq: Once | INTRAMUSCULAR | Status: AC
  Administered 2015-07-27: 60 mg via INTRAMUSCULAR
  Filled 2015-07-27: qty 2

## 2015-07-27 NOTE — ED Notes (Signed)
Patient stated when she "squeezed her head really hard that the pain eased off"

## 2015-07-27 NOTE — ED Provider Notes (Signed)
[facility] Emergency Department Provider Note  ____________________________________________  Time seen: Approximately 8:49 PM  I have reviewed the triage vital signs and the nursing notes.   HISTORY  Chief Complaint Migraine    HPI Deanna Vazquez is a 40 y.o. female who presents emergency department complaining of migraine headaches. Patient states that she has a long history of migraines that have been treated by her primary care provider. Patient states that she has taken many different abortive as well as preventative medications for same. Patient states that she recently tried acupuncture which resolved headaches for approximately 3 months. Patient states that she has a return of migraines. They're consistent with previous migraines with generalized headache, photo and phonophobia. Patient denies any neck pain, vision changes, chest pain, shortness of breath, nausea or vomiting, abdominal pain. Patient is tried over-the-counter medications this time with no relief. Patient states that pain as a throbbing, sharp sensation that is moderate to severe.   Past Medical History  Diagnosis Date  . Asthma   . Hypertension   . Chronic headache   . Chronic knee pain     There are no active problems to display for this patient.   Past Surgical History  Procedure Laterality Date  . Cesarean section      Current Outpatient Rx  Name  Route  Sig  Dispense  Refill  . butalbital-acetaminophen-caffeine (FIORICET) 50-325-40 MG per tablet   Oral   Take 1-2 tablets by mouth every 6 (six) hours as needed for headache.   20 tablet   0   . citalopram (CELEXA) 10 MG tablet   Oral   Take 10 mg by mouth daily.         . ferrous sulfate 325 (65 FE) MG tablet   Oral   Take 325 mg by mouth daily.         Marland Kitchen HYDROcodone-acetaminophen (NORCO/VICODIN) 5-325 MG per tablet   Oral   Take 1 tablet by mouth every 4 (four) hours as needed.   12 tablet   0   .  EXPIRED: isometheptene-acetaminophen-dichloralphenazone (MIDRIN) 65-100-325 MG capsule   Oral   Take 1 capsule by mouth every 4 (four) hours as needed for migraine. Maximum 5 capsules in 12 hours for migraine headaches, 8 capsules in 24 hours for tension headaches.   20 capsule   2   . meloxicam (MOBIC) 15 MG tablet   Oral   Take 1 tablet (15 mg total) by mouth daily.   30 tablet   2   . omeprazole (PRILOSEC) 20 MG capsule   Oral   Take 20 mg by mouth daily.         . predniSONE (STERAPRED UNI-PAK 21 TAB) 10 MG (21) TBPK tablet      Take 6 tablets on day 1 Take 5 tablets on day 2 Take 4 tablets on day 3 Take 3 tablets on day 4 Take 2 tablets on day 5 Take 1 tablet on day 6   21 tablet   0   . promethazine (PHENERGAN) 25 MG tablet   Oral   Take 1 tablet (25 mg total) by mouth every 6 (six) hours as needed for nausea or vomiting (or headache).   8 tablet   0   . topiramate (TOPAMAX) 100 MG tablet   Oral   Take 100 mg by mouth 2 (two) times daily.         Marland Kitchen venlafaxine (EFFEXOR) 37.5 MG tablet   Oral  Take 37.5 mg by mouth 2 (two) times daily.           Allergies Penicillins  No family history on file.  Social History Social History  Substance Use Topics  . Smoking status: Current Every Day Smoker -- 0.50 packs/day    Types: Cigarettes  . Smokeless tobacco: Never Used  . Alcohol Use: Yes     Comment: occasional     Review of Systems  Constitutional: No fever/chills Eyes: No visual changes.  ENT: No upper respiratory complaints. Cardiovascular: no chest pain. Respiratory: no cough. No SOB. Gastrointestinal: No abdominal pain.  No nausea, no vomiting.  Musculoskeletal: Negative for musculoskeletal pain. Skin: Negative for rash, abrasions, lacerations, ecchymosis. Neurological: Positive for headache but denies focal weakness or numbness. 10-point ROS otherwise negative.  ____________________________________________   PHYSICAL EXAM:  VITAL  SIGNS: ED Triage Vitals  Enc Vitals Group     BP 07/27/15 1927 156/99 mmHg     Pulse Rate 07/27/15 1927 79     Resp 07/27/15 1927 18     Temp 07/27/15 1927 98 F (36.7 C)     Temp Source 07/27/15 1927 Oral     SpO2 07/27/15 1927 97 %     Weight 07/27/15 1927 239 lb (108.41 kg)     Height 07/27/15 1927  (1.549 m)     Head Cir --      Peak Flow --      Pain Score 07/27/15 1927 10     Pain Loc --      Pain Edu? --      Excl. in GC? --      Constitutional: Alert and oriented. Well appearing and in no acute distress. Eyes: Conjunctivae are normal. PERRL. EOMI. Head: Atraumatic. Neck: No stridor. Neck is supple with full range of motion  Cardiovascular: Normal rate, regular rhythm. Normal S1 and S2.  Good peripheral circulation. Respiratory: Normal respiratory effort without tachypnea or retractions. Lungs CTAB. Good air entry to the bases with no decreased or absent breath sounds. Musculoskeletal: Full range of motion to all extremities. No gross deformities appreciated. Neurologic:  Normal speech and language. No gross focal neurologic deficits are appreciated. Cranial nerves II through XII are grossly intact. Skin:  Skin is warm, dry and intact. No rash noted. Psychiatric: Mood and affect are normal. Speech and behavior are normal. Patient exhibits appropriate insight and judgement.   ____________________________________________   LABS (all labs ordered are listed, but only abnormal results are displayed)  Labs Reviewed - No data to display ____________________________________________  EKG   ____________________________________________  RADIOLOGY   No results found.  ____________________________________________    PROCEDURES  Procedure(s) performed:       Medications  ketorolac (TORADOL) injection 60 mg (not administered)  prochlorperazine (COMPAZINE) injection 10 mg (not administered)  diphenhydrAMINE (BENADRYL) injection 50 mg (not administered)      ____________________________________________   INITIAL IMPRESSION / ASSESSMENT AND PLAN / ED COURSE  Pertinent labs & imaging results that were available during my care of the patient were reviewed by me and considered in my medical decision making (see chart for details).  Patient's diagnosis is consistent with Migraine headache. Patient reports that this is consistent with her baseline migraines. Exam is reassuring. No imaging as deemed necessary at this time. Patient reports good improvement with migraine cocktail and as such it is administered in the emergency department. Patient tolerated well.. Patient is to follow-up with her primary care provider for further management of chronic migraines.  Patient is given ED precautions to return to the ED for any worsening or new symptoms.     ____________________________________________  FINAL CLINICAL IMPRESSION(S) / ED DIAGNOSES  Final diagnoses:  Migraine without aura and without status migrainosus, not intractable      NEW MEDICATIONS STARTED DURING THIS VISIT:  New Prescriptions   No medications on file        This chart was dictated using voice recognition software/Dragon. Despite best efforts to proofread, errors can occur which can change the meaning. Any change was purely unintentional.    Racheal PatchesJonathan D Jannatul Wojdyla, PA-C 07/27/15 [id number]  Minna AntisKevin Paduchowski, MD 07/27/15 2330

## 2015-07-27 NOTE — ED Notes (Signed)
Patient with complaint of migraine. Patient states that she woke up with it this morning. Patient reports no improvement with OTC medications.

## 2015-07-27 NOTE — Discharge Instructions (Signed)
Recurrent Migraine Headache A migraine headache is an intense, throbbing pain on one or both sides of your head. Recurrent migraines keep coming back. A migraine can last for 30 minutes to several hours. CAUSES  The exact cause of a migraine headache is not always known. However, a migraine may be caused when nerves in the brain become irritated and release chemicals that cause inflammation. This causes pain. Certain things may also trigger migraines, such as:   Alcohol.  Smoking.  Stress.  Menstruation.  Aged cheeses.  Foods or drinks that contain nitrates, glutamate, aspartame, or tyramine.  Lack of sleep.  Chocolate.  Caffeine.  Hunger.  Physical exertion.  Fatigue.  Medicines used to treat chest pain (nitroglycerine), birth control pills, estrogen, and some blood pressure medicines. SYMPTOMS   Pain on one or both sides of your head.  Pulsating or throbbing pain.  Severe pain that prevents daily activities.  Pain that is aggravated by any physical activity.  Nausea, vomiting, or both.  Dizziness.  Pain with exposure to bright lights, loud noises, or activity.  General sensitivity to bright lights, loud noises, or smells. Before you get a migraine, you may get warning signs that a migraine is coming (aura). An aura may include:  Seeing flashing lights.  Seeing bright spots, halos, or zigzag lines.  Having tunnel vision or blurred vision.  Having feelings of numbness or tingling.  Having trouble talking.  Having muscle weakness. DIAGNOSIS  A recurrent migraine headache is often diagnosed based on:  Symptoms.  Physical examination.  A CT scan or MRI of your head. These imaging tests cannot diagnose migraines but can help rule out other causes of headaches.  TREATMENT  Medicines may be given for pain and nausea. Medicines can also be given to help prevent recurrent migraines. HOME CARE INSTRUCTIONS  Only take over-the-counter or prescription  medicines for pain or discomfort as directed by your health care provider. The use of long-term narcotics is not recommended.  Lie down in a dark, quiet room when you have a migraine.  Keep a journal to find out what may trigger your migraine headaches. For example, write down:  What you eat and drink.  How much sleep you get.  Any change to your diet or medicines.  Limit alcohol consumption.  Quit smoking if you smoke.  Get 7-9 hours of sleep, or as recommended by your health care provider.  Limit stress.  Keep lights dim if bright lights bother you and make your migraines worse. SEEK MEDICAL CARE IF:   You do not get relief from the medicines given to you.  You have a recurrence of pain.  You have a fever. SEEK IMMEDIATE MEDICAL CARE IF:  Your migraine becomes severe.  You have a stiff neck.  You have loss of vision.  You have muscular weakness or loss of muscle control.  You start losing your balance or have trouble walking.  You feel faint or pass out.  You have severe symptoms that are different from your first symptoms. MAKE SURE YOU:   Understand these instructions.  Will watch your condition.  Will get help right away if you are not doing well or get worse.   This information is not intended to replace advice given to you by your health care provider. Make sure you discuss any questions you have with your health care provider.   Document Released: 10/20/2000 Document Revised: 02/15/2014 Document Reviewed: 10/02/2012 Elsevier Interactive Patient Education 2016 [employer]  

## 2015-12-11 ENCOUNTER — Ambulatory Visit
Admission: RE | Admit: 2015-12-11 | Discharge: 2015-12-11 | Disposition: A | Discharge status: Home / Self Care | Payer: Medicaid Other | Source: Ambulatory Visit | Attending: Physician Assistant | Admitting: Physician Assistant

## 2015-12-11 ENCOUNTER — Other Ambulatory Visit: Payer: Self-pay | Admitting: Physician Assistant

## 2015-12-11 DIAGNOSIS — R079 Chest pain, unspecified: Secondary | ICD-10-CM | POA: Diagnosis not present

## 2015-12-11 DIAGNOSIS — R52 Pain, unspecified: Secondary | ICD-10-CM

## 2015-12-12 ENCOUNTER — Other Ambulatory Visit: Payer: Self-pay | Admitting: Physician Assistant

## 2015-12-12 DIAGNOSIS — R079 Chest pain, unspecified: Secondary | ICD-10-CM

## 2015-12-15 ENCOUNTER — Other Ambulatory Visit: Payer: Self-pay | Admitting: Nurse Practitioner

## 2015-12-15 DIAGNOSIS — Z1231 Encounter for screening mammogram for malignant neoplasm of breast: Secondary | ICD-10-CM

## 2016-01-21 ENCOUNTER — Ambulatory Visit: Payer: Medicaid Other | Attending: Nurse Practitioner

## 2016-04-01 ENCOUNTER — Encounter: Payer: Self-pay | Admitting: Emergency Medicine

## 2016-04-01 ENCOUNTER — Emergency Department
Admission: EM | Admit: 2016-04-01 | Discharge: 2016-04-01 | Disposition: A | Discharge status: Home / Self Care | Payer: Medicaid Other | Attending: Student in an Organized Health Care Education/Training Program | Admitting: Student in an Organized Health Care Education/Training Program

## 2016-04-01 DIAGNOSIS — J45909 Unspecified asthma, uncomplicated: Secondary | ICD-10-CM | POA: Insufficient documentation

## 2016-04-01 DIAGNOSIS — I10 Essential (primary) hypertension: Secondary | ICD-10-CM | POA: Insufficient documentation

## 2016-04-01 DIAGNOSIS — Z791 Long term (current) use of non-steroidal anti-inflammatories (NSAID): Secondary | ICD-10-CM | POA: Insufficient documentation

## 2016-04-01 DIAGNOSIS — G43809 Other migraine, not intractable, without status migrainosus: Secondary | ICD-10-CM | POA: Insufficient documentation

## 2016-04-01 DIAGNOSIS — F17210 Nicotine dependence, cigarettes, uncomplicated: Secondary | ICD-10-CM | POA: Insufficient documentation

## 2016-04-01 MED ORDER — KETOROLAC TROMETHAMINE 30 MG/ML IJ SOLN
15.0000 mg | Freq: Once | INTRAMUSCULAR | Status: AC
  Administered 2016-04-01: 15 mg via INTRAVENOUS
  Filled 2016-04-01: qty 1

## 2016-04-01 MED ORDER — METOCLOPRAMIDE HCL 5 MG/ML IJ SOLN
INTRAMUSCULAR | Status: AC
  Administered 2016-04-01: 10 mg via INTRAVENOUS
  Filled 2016-04-01: qty 2

## 2016-04-01 MED ORDER — KETOROLAC TROMETHAMINE 60 MG/2ML IM SOLN
INTRAMUSCULAR | Status: AC
  Administered 2016-04-01: 15 mg
  Filled 2016-04-01: qty 2

## 2016-04-01 MED ORDER — METOCLOPRAMIDE HCL 5 MG/ML IJ SOLN
10.0000 mg | Freq: Once | INTRAMUSCULAR | Status: AC
  Administered 2016-04-01: 10 mg via INTRAVENOUS

## 2016-04-01 MED ORDER — HALOPERIDOL LACTATE 5 MG/ML IJ SOLN
INTRAMUSCULAR | Status: AC
  Filled 2016-04-01: qty 1

## 2016-04-01 MED ORDER — SODIUM CHLORIDE 0.9 % IV BOLUS (SEPSIS)
1000.0000 mL | Freq: Once | INTRAVENOUS | Status: AC
  Administered 2016-04-01: 1000 mL via INTRAVENOUS

## 2016-04-01 MED ORDER — DIPHENHYDRAMINE HCL 50 MG/ML IJ SOLN
INTRAMUSCULAR | Status: AC
  Filled 2016-04-01: qty 1

## 2016-04-01 MED ORDER — DEXAMETHASONE SODIUM PHOSPHATE 10 MG/ML IJ SOLN
10.0000 mg | Freq: Once | INTRAMUSCULAR | Status: AC
  Administered 2016-04-01: 10 mg via INTRAVENOUS
  Filled 2016-04-01 (×2): qty 1

## 2016-04-01 MED ORDER — PROCHLORPERAZINE EDISYLATE 5 MG/ML IJ SOLN
10.0000 mg | Freq: Once | INTRAMUSCULAR | Status: AC
  Administered 2016-04-01: 10 mg via INTRAVENOUS
  Filled 2016-04-01: qty 2

## 2016-04-01 MED ORDER — PROCHLORPERAZINE EDISYLATE 5 MG/ML IJ SOLN
INTRAMUSCULAR | Status: AC
  Filled 2016-04-01: qty 2

## 2016-04-01 MED ORDER — ACETAMINOPHEN 500 MG PO TABS
1000.0000 mg | ORAL_TABLET | Freq: Once | ORAL | Status: AC
  Administered 2016-04-01: 1000 mg via ORAL
  Filled 2016-04-01: qty 2

## 2016-04-01 MED ORDER — DIPHENHYDRAMINE HCL 50 MG/ML IJ SOLN
25.0000 mg | Freq: Once | INTRAMUSCULAR | Status: AC
  Administered 2016-04-01: 25 mg via INTRAVENOUS

## 2016-04-01 MED ORDER — HALOPERIDOL LACTATE 5 MG/ML IJ SOLN
10.0000 mg | Freq: Once | INTRAMUSCULAR | Status: AC
  Administered 2016-04-01: 10 mg via INTRAMUSCULAR
  Filled 2016-04-01 (×2): qty 2

## 2016-04-01 NOTE — ED Triage Notes (Signed)
Pt to ED via EMS c/o migraine for a couple days worsening today.  States n/v x6 today, hx of migraines feeling similar, no relief with OTC medication at home.

## 2016-04-01 NOTE — ED Provider Notes (Signed)
[facility] Emergency Department Provider Note    First MD Initiated Contact with Patient 04/01/16 1414     (approximate)  I have reviewed the triage vital signs and the nursing notes.   HISTORY  Chief Complaint Migraine    HPI Chayse D Wires is a 41 y.o. female  history of chronic headaches and migraines presents with migraine headache that started 2 days ago. States is been gradually worsening. Associated with nausea vomiting as well as photophobia. No fevers or neck pain. States she had last time she had a headache this was bad was roughly 3 months ago. Has been trying Excedrin Migraine without relief at home. Denies any abdominal pain. No dysuria. No visual disturbances.  No trauma   Past Medical History:  Diagnosis Date  . Asthma   . Chronic headache   . Chronic knee pain   . Hypertension    History reviewed. No pertinent family history. Past Surgical History:  Procedure Laterality Date  . CESAREAN SECTION     There are no active problems to display for this patient.     Prior to Admission medications   Medication Sig Start Date End Date Taking? Authorizing Provider  citalopram (CELEXA) 10 MG tablet Take 10 mg by mouth daily.    Historical Provider, MD  ferrous sulfate 325 (65 FE) MG tablet Take 325 mg by mouth daily.    Historical Provider, MD  HYDROcodone-acetaminophen (NORCO/VICODIN) 5-325 MG per tablet Take 1 tablet by mouth every 4 (four) hours as needed. 10/28/14   Chinita Pester, FNP  isometheptene-acetaminophen-dichloralphenazone (MIDRIN) 65-100-325 MG capsule Take 1 capsule by mouth every 4 (four) hours as needed for migraine. Maximum 5 capsules in 12 hours for migraine headaches, 8 capsules in 24 hours for tension headaches. 07/10/14 07/10/15  Tommi Rumps, PA-C  meloxicam (MOBIC) 15 MG tablet Take 1 tablet (15 mg total) by mouth daily. 10/28/14   Chinita Pester, FNP  omeprazole (PRILOSEC) 20 MG capsule Take 20 mg by mouth  daily.    Historical Provider, MD  predniSONE (STERAPRED UNI-PAK 21 TAB) 10 MG (21) TBPK tablet Take 6 tablets on day 1 Take 5 tablets on day 2 Take 4 tablets on day 3 Take 3 tablets on day 4 Take 2 tablets on day 5 Take 1 tablet on day 6 10/28/14   Cari B Triplett, FNP  promethazine (PHENERGAN) 25 MG tablet Take 1 tablet (25 mg total) by mouth every 6 (six) hours as needed for nausea or vomiting (or headache). 01/25/14   Samuel Jester, DO  topiramate (TOPAMAX) 100 MG tablet Take 100 mg by mouth 2 (two) times daily.    Historical Provider, MD  venlafaxine (EFFEXOR) 37.5 MG tablet Take 37.5 mg by mouth 2 (two) times daily.    Historical Provider, MD    Allergies Penicillins    Social History Social History  Substance Use Topics  . Smoking status: Current Every Day Smoker    Packs/day: 1.00    Types: Cigarettes  . Smokeless tobacco: Never Used  . Alcohol use No    Review of Systems Patient denies headaches, rhinorrhea, blurry vision, numbness, shortness of breath, chest pain, edema, cough, abdominal pain, nausea, vomiting, diarrhea, dysuria, fevers, rashes or hallucinations unless otherwise stated above in HPI. ____________________________________________   PHYSICAL EXAM:  VITAL SIGNS: Vitals:   04/01/16 1148  BP: (!) 159/97  Pulse: 81  Resp: 20  Temp: 98.5 F (36.9 C)    Constitutional: Alert and oriented. in  no acute distress. Eyes: Conjunctivae are normal. PERRL. EOMI. Head: Atraumatic. Nose: No congestion/rhinnorhea. Mouth/Throat: Mucous membranes are moist.  Oropharynx non-erythematous. Neck: No stridor. Painless ROM. No cervical spine tenderness to palpation Hematological/Lymphatic/Immunilogical: No cervical lymphadenopathy. Cardiovascular: Normal rate, regular rhythm. Grossly normal heart sounds.  Good peripheral circulation. Respiratory: Normal respiratory effort.  No retractions. Lungs CTAB. Gastrointestinal: Soft and nontender. No distention. No  abdominal bruits. No CVA tenderness. Musculoskeletal: No lower extremity tenderness nor edema.  No joint effusions. Neurologic:  CN- intact.  No facial droop, Normal FNF.  Normal heel to shin.  Sensation intact bilaterally. Normal speech and language. No gross focal neurologic deficits are appreciated. No gait instability. Skin:  Skin is warm, dry and intact. No rash noted. Psychiatric: Mood and affect are normal. Speech and behavior are normal.  ____________________________________________   LABS (all labs ordered are listed, but only abnormal results are displayed)  No results found for this or any previous visit (from the past 24 hour(s)). ____________________________________________  ____________________________________________  RADIOLOGY   ____________________________________________   PROCEDURES  Procedure(s) performed:  Procedures    Critical Care performed: no ____________________________________________   INITIAL IMPRESSION / ASSESSMENT AND PLAN / ED COURSE  Pertinent labs & imaging results that were available during my care of the patient were reviewed by me and considered in my medical decision making (see chart for details).  DDX: migraine, tension, gca, sah, sdh, cluster  Lilianne D Wayne SeverWatters is a 41 y.o. who presents to the ED with with Hx of migraines p/w HA for last 3 days. Not worst HA ever. Gradual onset. HA similar to previous episodes. Denies focal neurologic symptoms. Denies trauma. No fevers or neck pain. No vision loss. Afebrile in ED. VSS. Exam as above. No meningeal signs. No CN, motor, sensory or cerebellar deficits. Temporal arteries palpable and non-tender. Appears well and non-toxic.  Will provide IV fluids for hydration and IV medications for symptom control.  Likely tension, non-specific or possible migraine HA. Clinical picture is not consistent with ICH, SAH, SDH, EDH, TIA, or CVA. No concern for meningitis or encephalitis. No concern for  GCA/Temporal arteritis.  ----------------------------------------- 5:27 PM on 04/01/2016 ----------------------------------------- Pain improved. Repeat neuro exam is again without focal deficit, nuchal rigidity or evidence of meningeal irritation.  Stable to D/C home, follow up with PCP or Neurology if persistent recurrent Has.  Have discussed with the patient and available family all diagnostics and treatments performed thus far and all questions were answered to the best of my ability. The patient demonstrates understanding and agreement with plan.        ____________________________________________   FINAL CLINICAL IMPRESSION(S) / ED DIAGNOSES  Final diagnoses:  Other migraine without status migrainosus, not intractable      NEW MEDICATIONS STARTED DURING THIS VISIT:  New Prescriptions   No medications on file     Note:  This document was prepared using Dragon voice recognition software and may include unintentional dictation errors.    Willy EddyPatrick Adeja Sarratt, MD 04/01/16 [phone]

## 2017-02-18 ENCOUNTER — Ambulatory Visit: Payer: Self-pay | Admitting: Nurse Practitioner

## 2017-03-20 ENCOUNTER — Other Ambulatory Visit: Payer: Self-pay | Admitting: Internal Medicine

## 2017-03-21 NOTE — Telephone Encounter (Signed)
Pt last here 10/18 and missed 1/19

## 2017-03-25 ENCOUNTER — Ambulatory Visit: Payer: Self-pay | Admitting: Nurse Practitioner

## 2017-03-28 ENCOUNTER — Encounter: Payer: Self-pay | Admitting: Nurse Practitioner

## 2017-03-28 ENCOUNTER — Ambulatory Visit: Payer: Self-pay | Admitting: Nurse Practitioner

## 2017-03-28 VITALS — BP 150/92 | HR 61 | Resp 16 | Ht 63.0 in | Wt 270.0 lb

## 2017-03-28 DIAGNOSIS — R635 Abnormal weight gain: Secondary | ICD-10-CM

## 2017-03-28 DIAGNOSIS — G5701 Lesion of sciatic nerve, right lower limb: Secondary | ICD-10-CM

## 2017-03-28 DIAGNOSIS — F411 Generalized anxiety disorder: Secondary | ICD-10-CM

## 2017-03-28 DIAGNOSIS — I10 Essential (primary) hypertension: Secondary | ICD-10-CM

## 2017-03-28 MED ORDER — TRAMADOL HCL 50 MG PO TABS: 50.0000 mg | ORAL_TABLET | Freq: Three times a day (TID) | ORAL | 2 refills | Status: DC

## 2017-03-28 MED ORDER — BUPROPION HCL 100 MG PO TABS: 100.0000 mg | ORAL_TABLET | Freq: Two times a day (BID) | ORAL | 3 refills | Status: DC

## 2017-03-28 MED ORDER — PREDNISONE 10 MG (21) PO TBPK: ORAL_TABLET | ORAL | 0 refills | Status: DC

## 2017-03-28 MED ORDER — HYDROCHLOROTHIAZIDE 25 MG PO TABS: 25.0000 mg | ORAL_TABLET | Freq: Every day | ORAL | 3 refills | Status: DC

## 2017-03-28 MED ORDER — LOSARTAN POTASSIUM 50 MG PO TABS: 50.0000 mg | ORAL_TABLET | Freq: Every day | ORAL | 3 refills | Status: DC

## 2017-03-28 MED ORDER — VENLAFAXINE HCL ER 37.5 MG PO TB24: 3.0000 | ORAL_TABLET | Freq: Every day | ORAL | 3 refills | Status: DC

## 2017-03-28 NOTE — Progress Notes (Signed)
[facility] [address] [place], [place] [address]  Internal MEDICINE  Office Visit Note  Patient Name: Deanna Vazquez  [id number] 24, 1977  [id number]  Date of Service: 04/06/2017  Chief Complaint  Patient presents with  . Pain    right above ankle all the way down to her foot, she feels its numb  . Medication Management    The patient is here for routine follow up. She is concerned about significant weight gain. Happened since she was started on pristiq and no longer on wellbutrin. She has not changed her eating habits or exercise habits. Does not understand how weight gain has happened. She is also very fatigued at all times.  She also states that she suddenly developed some pain in right hip/butt-cheek area. Pain radiated down the right leg and into the right foot. Now is c/o numbness in right foot and ankle. Pain in right hip area has basically subsided.     Pt is here for routine follow up.    Current Medication: Outpatient Encounter Medications as of 03/28/2017  Medication Sig Note  . ferrous sulfate 325 (65 FE) MG tablet Take 325 mg by mouth daily. [facility]25[facility]2014: [facility] Sig:   . meloxicam (MOBIC) 15 MG tablet Take 1 tablet (15 mg total) by mouth daily.   Marland Kitchen. omeprazole (PRILOSEC) 20 MG capsule Take 20 mg by mouth daily. 01/25/2014: [facility] Received Sig:   . promethazine (PHENERGAN) 25 MG tablet Take 1 tablet (25 mg total) by mouth every 6 (six) hours as needed for nausea or vomiting (or headache).   . traMADol (ULTRAM) 50 MG tablet Take 1 tablet (50 mg total) by mouth 3 (three) times daily. Take 2 tab 3 times daily. (Patient taking differently: Take 50 mg by mouth 3 (three) times daily. Take 1 tab 3 times daily.)   . [DISCONTINUED] desvenlafaxine (PRISTIQ) 50 MG 24 hr tablet Take 50 mg by mouth daily.   . [DISCONTINUED] losartan-hydrochlorothiazide (HYZAAR) 50-12.5 MG tablet Take 1 tablet by  mouth daily.   . [DISCONTINUED] traMADol (ULTRAM) 50 MG tablet Take 50 mg by mouth 3 (three) times daily. Take 2 tab 3 times daily.   . [DISCONTINUED] venlafaxine (EFFEXOR) 37.5 MG tablet Take 37.5 mg by mouth 2 (two) times daily.   Marland Kitchen. buPROPion (WELLBUTRIN) 100 MG tablet Take 1 tablet (100 mg total) by mouth 2 (two) times daily.   . hydrochlorothiazide (HYDRODIURIL) 25 MG tablet Take 1 tablet (25 mg total) by mouth daily.   Marland Kitchen. HYDROcodone-acetaminophen (NORCO/VICODIN) 5-325 MG per tablet Take 1 tablet by mouth every 4 (four) hours as needed. (Patient not taking: Reported on 03/28/2017)   . isometheptene-acetaminophen-dichloralphenazone (MIDRIN) 65-100-325 MG capsule Take 1 capsule by mouth every 4 (four) hours as needed for migraine. Maximum 5 capsules in 12 hours for migraine headaches, 8 capsules in 24 hours for tension headaches.   . losartan (COZAAR) 50 MG tablet Take 1 tablet (50 mg total) by mouth daily.   . predniSONE (STERAPRED UNI-PAK 21 TAB) 10 MG (21) TBPK tablet 6 day taper - take by mouth as directed for 6 days   . topiramate (TOPAMAX) 100 MG tablet Take 100 mg by mouth 2 (two) times daily.   . Venlafaxine HCl 37.5 MG TB24 Take 3 tablets (112.5 mg total) by mouth daily.   . [DISCONTINUED] citalopram (CELEXA) 10 MG tablet Take 10 mg by mouth daily. 01/25/2014: [facility] Received Sig:   . [  DISCONTINUED] predniSONE (STERAPRED UNI-PAK 21 TAB) 10 MG (21) TBPK tablet Take 6 tablets on day 1 Take 5 tablets on day 2 Take 4 tablets on day 3 Take 3 tablets on day 4 Take 2 tablets on day 5 Take 1 tablet on day 6 (Patient not taking: Reported on 03/28/2017)    No facility-administered encounter medications on file as of 03/28/2017.     Surgical History: Past Surgical History:  Procedure Laterality Date  . CESAREAN SECTION      Medical History: Past Medical History:  Diagnosis Date  . Asthma   . Chronic headache   . Chronic knee pain   . Hypertension      Family History: Family History  Problem Relation Age of Onset  . Hypertension Mother   . Diabetes Mother   . Hyperlipidemia Mother   . Cancer Mother   . Stroke Mother   . COPD Father     Social History   Socioeconomic History  . Marital status: Divorced    Spouse name: Not on file  . Number of children: Not on file  . Years of education: Not on file  . Highest education level: Not on file  Social Needs  . Financial resource strain: Not on file  . Food insecurity - worry: Not on file  . Food insecurity - inability: Not on file  . Transportation needs - medical: Not on file  . Transportation needs - non-medical: Not on file  Occupational History  . Not on file  Tobacco Use  . Smoking status: Current Every Day Smoker    Packs/day: 1.00    Types: Cigarettes  . Smokeless tobacco: Never Used  Substance and Sexual Activity  . Alcohol use: No  . Drug use: No  . Sexual activity: Not on file  Other Topics Concern  . Not on file  Social History Narrative  . Not on file      Review of Systems  Constitutional: Positive for activity change and unexpected weight change. Negative for chills and fatigue.       Considerable weight gain since being on pristiq and off wellbutrin.   HENT: Negative for congestion, rhinorrhea, sneezing and sore throat.   Eyes: Negative.  Negative for redness.  Respiratory: Positive for shortness of breath. Negative for cough and chest tightness.   Cardiovascular: Positive for leg swelling. Negative for chest pain and palpitations.  Gastrointestinal: Negative for abdominal pain, constipation, diarrhea, nausea and vomiting.  Endocrine: Negative for cold intolerance, heat intolerance, polydipsia, polyphagia and polyuria.  Genitourinary: Negative for dysuria and frequency.  Musculoskeletal: Positive for arthralgias and back pain. Negative for joint swelling and neck pain.       C/o low back pain radiating to the hips, more on right side. Now hurting  down to right knee and ankle. Some swelling present.   Skin: Negative for rash.  Allergic/Immunologic: Negative for environmental allergies, food allergies and immunocompromised state.  Neurological: Negative.  Negative for tremors, numbness and headaches.  Hematological: Negative for adenopathy. Does not bruise/bleed easily.  Psychiatric/Behavioral: Negative for behavioral problems (Depression), sleep disturbance and suicidal ideas. The patient is not nervous/anxious.     Today's Vitals   03/28/17 1204  BP: (!) 150/92  Pulse: 61  Resp: 16  SpO2: 99%  Weight: 270 lb (122.5 kg)  Height: 5\' 3"  (1.6 m)    Physical Exam  Constitutional: She is oriented to person, place, and time. She appears well-developed and well-nourished. No distress.  Morbidly obese.  HENT:  Head: Normocephalic and atraumatic.  Mouth/Throat: Oropharynx is clear and moist. No oropharyngeal exudate.  Eyes: EOM are normal. Pupils are equal, round, and reactive to light.  Neck: Normal range of motion. Neck supple. No JVD present. No tracheal deviation present. No thyromegaly present.  Cardiovascular: Normal rate, regular rhythm and normal heart sounds. Exam reveals no gallop and no friction rub.  No murmur heard. Mild edema in bilateral lower legs, worse on right side.   Pulmonary/Chest: Effort normal and breath sounds normal. No respiratory distress. She has no wheezes. She has no rales. She exhibits no tenderness.  Abdominal: Soft. Bowel sounds are normal. There is no tenderness.  Musculoskeletal: Normal range of motion.  Moderate lower back pain, radiating to both hips, more so on right side. Radiating down toright knee and right lower leg. No visible or palpable abnormalities present today.   Lymphadenopathy:    She has no cervical adenopathy.  Neurological: She is alert and oriented to person, place, and time. No cranial nerve deficit.  Skin: Skin is warm and dry. She is not diaphoretic.  Psychiatric: Her  speech is normal and behavior is normal. Judgment and thought content normal. Her mood appears anxious. Cognition and memory are normal. She expresses no homicidal and no suicidal ideation.  Nursing note and vitals reviewed.  Assessment/Plan: 1. Essential hypertension Add back HCTZ 25mg  daily. Continue losartan daily.  - hydrochlorothiazide (HYDRODIURIL) 25 MG tablet; Take 1 tablet (25 mg total) by mouth daily.  Dispense: 30 tablet; Refill: 3 - losartan (COZAAR) 50 MG tablet; Take 1 tablet (50 mg total) by mouth daily.  Dispense: 30 tablet; Refill: 3  2. GAD (generalized anxiety disorder) Stop pristiq. Continue venlafaxine and add back wellbutrin twice daily.  - Venlafaxine HCl 37.5 MG TB24; Take 3 tablets (112.5 mg total) by mouth daily.  Dispense: 90 each; Refill: 3 - buPROPion (WELLBUTRIN) 100 MG tablet; Take 1 tablet (100 mg total) by mouth 2 (two) times daily.  Dispense: 60 tablet; Refill: 3  3. Sciatic mononeuropathy, right - predniSONE (STERAPRED UNI-PAK 21 TAB) 10 MG (21) TBPK tablet; 6 day taper - take by mouth as directed for 6 days  Dispense: 21 tablet; Refill: 0 - traMADol (ULTRAM) 50 MG tablet; Take 1 tablet (50 mg total) by mouth 3 (three) times daily. Take 2 tab 3 times daily. (Patient taking differently: Take 50 mg by mouth 3 (three) times daily. Take 1 tab 3 times daily.)  Dispense: 30 tablet; Refill: 2  4. Abnormal weight gain - Basic Metabolic Panel (BMET) - TSH + free T4  General Counseling: Deanna Vazquez verbalizes understanding of the findings of todays visit and agrees with plan of treatment. I have discussed any further diagnostic evaluation that may be needed or ordered today. We also reviewed her medications today. she has been encouraged to call the office with any questions or concerns that should arise related to todays visit.  Hypertension Counseling:   The following hypertensive lifestyle modification were recommended and discussed:  1. Limiting alcohol intake to  less than 1 oz/day of ethanol:(24 oz of beer or 8 oz of wine or 2 oz of 100-proof whiskey). 2. Take baby ASA 81 mg daily. 3. Importance of regular aerobic exercise and losing weight. 4. Reduce dietary saturated fat and cholesterol intake for overall cardiovascular health. 5. Maintaining adequate dietary potassium, calcium, and magnesium intake. 6. Regular monitoring of the blood pressure. 7. Reduce sodium intake to less than 100 mmol/day (less than 2.3 gm of sodium or  less than 6 gm of sodium choride)    This patient was seen by Vincent Gros, FNP- C in Collaboration with Dr Lyndon Code as a part of collaborative care agreement    Orders Placed This Encounter  Procedures  . Basic Metabolic Panel (BMET)  . TSH + free T4    Meds ordered this encounter  Medications  . Venlafaxine HCl 37.5 MG TB24    Sig: Take 3 tablets (112.5 mg total) by mouth daily.    Dispense:  90 each    Refill:  3    Order Specific Question:   Supervising Provider    Answer:   Lyndon Code [[id number]]  . hydrochlorothiazide (HYDRODIURIL) 25 MG tablet    Sig: Take 1 tablet (25 mg total) by mouth daily.    Dispense:  30 tablet    Refill:  3    Order Specific Question:   Supervising Provider    Answer:   Lyndon Code [[id number]]  . losartan (COZAAR) 50 MG tablet    Sig: Take 1 tablet (50 mg total) by mouth daily.    Dispense:  30 tablet    Refill:  3    Order Specific Question:   Supervising Provider    Answer:   Lyndon Code [[id number]]  . buPROPion (WELLBUTRIN) 100 MG tablet    Sig: Take 1 tablet (100 mg total) by mouth 2 (two) times daily.    Dispense:  60 tablet    Refill:  3    Order Specific Question:   Supervising Provider    Answer:   Lyndon Code [[id number]]  . predniSONE (STERAPRED UNI-PAK 21 TAB) 10 MG (21) TBPK tablet    Sig: 6 day taper - take by mouth as directed for 6 days    Dispense:  21 tablet    Refill:  0    Order Specific Question:   Supervising Provider    Answer:   Lyndon Code [[id number]]  .  traMADol (ULTRAM) 50 MG tablet    Sig: Take 1 tablet (50 mg total) by mouth 3 (three) times daily. Take 2 tab 3 times daily.    Dispense:  30 tablet    Refill:  2    Order Specific Question:   Supervising Provider    Answer:   Lyndon Code [[id number]]    Time spent: 69 Minutes    Dr Lyndon Code Internal medicine

## 2017-04-06 DIAGNOSIS — F411 Generalized anxiety disorder: Secondary | ICD-10-CM | POA: Insufficient documentation

## 2017-04-06 DIAGNOSIS — I10 Essential (primary) hypertension: Secondary | ICD-10-CM | POA: Insufficient documentation

## 2017-04-06 DIAGNOSIS — R635 Abnormal weight gain: Secondary | ICD-10-CM | POA: Insufficient documentation

## 2017-04-06 DIAGNOSIS — G5701 Lesion of sciatic nerve, right lower limb: Secondary | ICD-10-CM | POA: Insufficient documentation

## 2017-04-07 NOTE — Telephone Encounter (Signed)
No, will need to be seen but whenever we have opening and no double booking

## 2017-04-08 ENCOUNTER — Other Ambulatory Visit: Payer: Self-pay

## 2017-04-08 MED ORDER — MELOXICAM 15 MG PO TABS: 15.0000 mg | ORAL_TABLET | Freq: Every day | ORAL | 2 refills | Status: DC

## 2017-04-13 ENCOUNTER — Other Ambulatory Visit: Payer: Self-pay

## 2017-04-13 MED ORDER — MELOXICAM 15 MG PO TABS: 15.0000 mg | ORAL_TABLET | Freq: Every day | ORAL | 2 refills | Status: DC

## 2017-05-10 ENCOUNTER — Other Ambulatory Visit: Payer: Self-pay

## 2017-05-10 MED ORDER — VENLAFAXINE HCL ER 75 MG PO CP24: ORAL_CAPSULE | ORAL | 1 refills | Status: DC

## 2017-05-10 NOTE — Telephone Encounter (Signed)
Pt called that venlafaxine XR 37.5 mg tab is expensive and she want to go back on venlafaxine 75 as per heather change and send to [facility]

## 2017-06-27 ENCOUNTER — Ambulatory Visit: Payer: Self-pay | Admitting: Nurse Practitioner

## 2017-06-30 ENCOUNTER — Ambulatory Visit: Payer: Self-pay | Admitting: Nurse Practitioner

## 2017-07-02 ENCOUNTER — Encounter: Payer: Self-pay | Admitting: Emergency Medicine

## 2017-07-02 ENCOUNTER — Other Ambulatory Visit: Payer: Self-pay

## 2017-07-02 ENCOUNTER — Emergency Department
Admission: EM | Admit: 2017-07-02 | Discharge: 2017-07-02 | Disposition: A | Discharge status: Home / Self Care | Payer: Self-pay | Attending: Emergency Medicine | Admitting: Emergency Medicine

## 2017-07-02 DIAGNOSIS — F40298 Other specified phobia: Secondary | ICD-10-CM | POA: Insufficient documentation

## 2017-07-02 DIAGNOSIS — R1110 Vomiting, unspecified: Secondary | ICD-10-CM | POA: Insufficient documentation

## 2017-07-02 DIAGNOSIS — F17210 Nicotine dependence, cigarettes, uncomplicated: Secondary | ICD-10-CM | POA: Insufficient documentation

## 2017-07-02 DIAGNOSIS — J45909 Unspecified asthma, uncomplicated: Secondary | ICD-10-CM | POA: Insufficient documentation

## 2017-07-02 DIAGNOSIS — G43809 Other migraine, not intractable, without status migrainosus: Secondary | ICD-10-CM | POA: Insufficient documentation

## 2017-07-02 DIAGNOSIS — I10 Essential (primary) hypertension: Secondary | ICD-10-CM | POA: Insufficient documentation

## 2017-07-02 DIAGNOSIS — H53149 Visual discomfort, unspecified: Secondary | ICD-10-CM | POA: Insufficient documentation

## 2017-07-02 DIAGNOSIS — Z79899 Other long term (current) drug therapy: Secondary | ICD-10-CM | POA: Insufficient documentation

## 2017-07-02 MED ORDER — KETOROLAC TROMETHAMINE 30 MG/ML IJ SOLN
30.0000 mg | Freq: Once | INTRAMUSCULAR | Status: AC
  Administered 2017-07-02: 30 mg via INTRAVENOUS
  Filled 2017-07-02: qty 1

## 2017-07-02 MED ORDER — DEXAMETHASONE SODIUM PHOSPHATE 10 MG/ML IJ SOLN
10.0000 mg | Freq: Once | INTRAMUSCULAR | Status: AC
  Administered 2017-07-02: 10 mg via INTRAVENOUS
  Filled 2017-07-02: qty 1

## 2017-07-02 MED ORDER — DIPHENHYDRAMINE HCL 50 MG/ML IJ SOLN
12.5000 mg | Freq: Once | INTRAMUSCULAR | Status: AC
  Administered 2017-07-02: 12.5 mg via INTRAVENOUS
  Filled 2017-07-02: qty 1

## 2017-07-02 MED ORDER — HYDROMORPHONE HCL 1 MG/ML IJ SOLN
0.5000 mg | Freq: Once | INTRAMUSCULAR | Status: AC
  Administered 2017-07-02: 0.5 mg via INTRAVENOUS
  Filled 2017-07-02: qty 1

## 2017-07-02 MED ORDER — PROCHLORPERAZINE EDISYLATE 10 MG/2ML IJ SOLN
10.0000 mg | Freq: Once | INTRAMUSCULAR | Status: AC
  Administered 2017-07-02: 10 mg via INTRAVENOUS
  Filled 2017-07-02: qty 2

## 2017-07-02 MED ORDER — SODIUM CHLORIDE 0.9 % IV BOLUS
1000.0000 mL | Freq: Once | INTRAVENOUS | Status: AC
  Administered 2017-07-02: 1000 mL via INTRAVENOUS

## 2017-07-02 NOTE — Discharge Instructions (Addendum)
Please follow-up with your regular doctor. Please return for any further problems including worsening headache or fever.

## 2017-07-02 NOTE — ED Notes (Signed)
Pt resting quietly with eyes closed at this time.  Room dark for patient comfort.  Respirations equal and unlabored.

## 2017-07-02 NOTE — ED Provider Notes (Signed)
[facility] Emergency Department Provider Note   ____________________________________________   First MD Initiated Contact with Patient 07/02/17 [phone]     (approximate)  I have reviewed the triage vital signs and the nursing notes.   HISTORY  Chief Complaint Migraine    HPI Deanna Vazquez is a 42 y.o. female Patient reports 3 AM onset of her usual severe migraine with vomiting. She cannot keep down medicines for her migraines. She reports this is just like it always is a lot of photophobia and phonophobia vomiting. Throbbing headache. I understand it is diffuse at this time. She says usually a cocktail of Benadryl Phenergan and Toradol works on her very well. I will use Toradol Benadryl and Compazine and fluids.   Past Medical History:  Diagnosis Date  . Asthma   . Chronic headache   . Chronic knee pain   . Hypertension     Patient Active Problem List   Diagnosis Date Noted  . Essential hypertension 04/06/2017  . GAD (generalized anxiety disorder) 04/06/2017  . Sciatic mononeuropathy, right 04/06/2017  . Abnormal weight gain 04/06/2017    Past Surgical History:  Procedure Laterality Date  . CESAREAN SECTION      Prior to Admission medications   Medication Sig Start Date End Date Taking? Authorizing Provider  hydrochlorothiazide (HYDRODIURIL) 25 MG tablet Take 1 tablet (25 mg total) by mouth daily. 03/28/17  Yes Boscia, Kathlynn Grate, NP  losartan (COZAAR) 50 MG tablet Take 1 tablet (50 mg total) by mouth daily. 03/28/17  Yes Boscia, Kathlynn Grate, NP  meloxicam (MOBIC) 15 MG tablet Take 1 tablet (15 mg total) by mouth daily. 04/13/17  Yes Boscia, Kathlynn Grate, NP  omeprazole (PRILOSEC) 20 MG capsule Take 20 mg by mouth daily.   Yes [provider]  traMADol (ULTRAM) 50 MG tablet Take 1 tablet (50 mg total) by mouth 3 (three) times daily. Take 2 tab 3 times daily. Patient taking differently: Take 50 mg by mouth 3 (three) times daily. Take 1 tab 3  times daily. 03/28/17  Yes Carlean Jews, NP  venlafaxine XR (EFFEXOR-XR) 75 MG 24 hr capsule Take 3 cap po daily Patient taking differently: Take 225 mg by mouth daily.  05/10/17  Yes Carlean Jews, NP    Allergies Penicillins  Family History  Problem Relation Age of Onset  . Hypertension Mother   . Diabetes Mother   . Hyperlipidemia Mother   . Cancer Mother   . Stroke Mother   . COPD Father     Social History Social History   Tobacco Use  . Smoking status: Current Every Day Smoker    Packs/day: 1.00    Types: Cigarettes  . Smokeless tobacco: Never Used  Substance Use Topics  . Alcohol use: No  . Drug use: No    Review of Systems  Constitutional: No fever/chills Eyes: No visual changes. ENT: No sore throat. Cardiovascular: Denies chest pain. Respiratory: Denies shortness of breath. Gastrointestinal: No abdominal pain.  No nausea, no vomiting.  No diarrhea.  No constipation. Genitourinary: Negative for dysuria. Musculoskeletal: Negative for back pain. Skin: Negative for rash. Neurological: Negative for  focal weakness  ____________________________________________   PHYSICAL EXAM:  VITAL SIGNS: ED Triage Vitals [07/02/17 0749]  Enc Vitals Group     BP (!) 134/95     Pulse Rate 83     Resp 20     Temp 98.1 F (36.7 C)     Temp Source Oral  SpO2 97 %     Weight 240 lb (108.9 kg)     Height  (1.6 m)     Head Circumference      Peak Flow      Pain Score 10     Pain Loc      Pain Edu?      Excl. in [place]?     Constitutional: Alert and oriented. he pain and vomiting Eyes: Conjunctivae are normal.  Head: Atraumatic. Nose: No congestion/rhinnorhea. Mouth/Throat: Mucous membranes are moist.  Oropharynx non-erythematous. Neck: No stridor.  Cardiovascular: Normal rate, regular rhythm. Grossly normal heart sounds.  Good peripheral circulation. Respiratory: Normal respiratory effort.  No retractions. Lungs CTAB. Gastrointestinal: Soft and  nontender. No distention. No abdominal bruits. No CVA tenderness. Musculoskeletal: No lower extremity tenderness nor edema.   Neurologic:  Normal speech and language. No gross focal neurologic deficits are appreciated. specifically cranial nerves II through XII are intact. Visual fields were not checked cerebellar rapid alternating movements in the fingers are normal motor strength is 5 over 5 throughout Skin:  Skin is warm, dry and intact. No rash noted. Psychiatric: Mood and affect are normal. Speech and behavior are normal.  ____________________________________________   LABS (all labs ordered are listed, but only abnormal results are displayed)  Labs Reviewed - No data to display ____________________________________________  EKG   ____________________________________________  RADIOLOGY  ED MD interpretation:   Official radiology report(s): No results found.  ____________________________________________   PROCEDURES  Procedure(s) performed:   Procedures  Critical Care performed:   ____________________________________________   INITIAL IMPRESSION / ASSESSMENT AND PLAN / ED COURSE    ----------------------------------------- 11:35 AM on 07/02/2017 -----------------------------------------  Patient got her usual migraine cocktail but she reports she still has a bad headache. I was able at least this time to examine her fundi which appear to be normal. I will give her some Dilaudid and dexamethasone to see if this helps with her headache.  after second dose of medicine patient better we will let her go.     ____________________________________________   FINAL CLINICAL IMPRESSION(S) / ED DIAGNOSES  Final diagnoses:  Other migraine without status migrainosus, not intractable     ED Discharge Orders    None       Note:  This document was prepared using Dragon voice recognition software and may include unintentional dictation errors.    Arnaldo Natal, MD 07/02/17 1530

## 2017-07-02 NOTE — ED Notes (Signed)
Pt c/o migraine headache this morning, pt states she has hx of migraine and feels the same. Pt reports photophobia as well as nausea and vomiting. Pt vomited x 1 in room and had incontinent episode at the same time. Pt is alert and oriented x 4.  Pt states she usually takes benadryl, phenergan and toradol for relief. Dr. Darnelle Catalan in room to discuss plan of care with here.

## 2017-07-02 NOTE — ED Triage Notes (Signed)
Patient presents to the ED with a migraine that began around 3am.  Patient states she has vomited approx. 5 times.  Patient states, "Everything I take for it comes right back up."  Patient is tearful and moaning in triage.  Patient reports history of migraines and states this feels the same.  Patient states it has been approx. 3 months since her last migraine.  Patient is very photophobic.

## 2017-07-05 ENCOUNTER — Other Ambulatory Visit: Payer: Self-pay

## 2017-07-05 MED ORDER — MELOXICAM 15 MG PO TABS: 15.0000 mg | ORAL_TABLET | Freq: Every day | ORAL | 2 refills | Status: DC

## 2017-07-12 ENCOUNTER — Ambulatory Visit: Payer: Self-pay | Admitting: Nurse Practitioner

## 2017-08-08 ENCOUNTER — Other Ambulatory Visit: Payer: Self-pay | Admitting: Nurse Practitioner

## 2017-08-08 MED ORDER — VENLAFAXINE HCL ER 75 MG PO CP24: ORAL_CAPSULE | ORAL | 1 refills | Status: DC

## 2017-10-07 ENCOUNTER — Other Ambulatory Visit: Payer: Self-pay | Admitting: Nurse Practitioner

## 2017-10-07 DIAGNOSIS — I10 Essential (primary) hypertension: Secondary | ICD-10-CM

## 2017-10-07 MED ORDER — LOSARTAN POTASSIUM 50 MG PO TABS: 50.0000 mg | ORAL_TABLET | Freq: Every day | ORAL | 3 refills | Status: DC

## 2018-08-10 ENCOUNTER — Other Ambulatory Visit: Payer: Self-pay | Admitting: Internal Medicine

## 2018-08-10 DIAGNOSIS — Z1231 Encounter for screening mammogram for malignant neoplasm of breast: Secondary | ICD-10-CM

## 2018-09-03 ENCOUNTER — Other Ambulatory Visit: Payer: Self-pay

## 2018-09-03 ENCOUNTER — Emergency Department
Admission: EM | Admit: 2018-09-03 | Discharge: 2018-09-03 | Disposition: A | Discharge status: Home / Self Care | Payer: BLUE CROSS/BLUE SHIELD | Attending: Emergency Medicine | Admitting: Emergency Medicine

## 2018-09-03 ENCOUNTER — Emergency Department: Payer: BLUE CROSS/BLUE SHIELD

## 2018-09-03 ENCOUNTER — Encounter: Payer: Self-pay | Admitting: Emergency Medicine

## 2018-09-03 DIAGNOSIS — Y929 Unspecified place or not applicable: Secondary | ICD-10-CM | POA: Diagnosis not present

## 2018-09-03 DIAGNOSIS — F17210 Nicotine dependence, cigarettes, uncomplicated: Secondary | ICD-10-CM | POA: Diagnosis not present

## 2018-09-03 DIAGNOSIS — M1711 Unilateral primary osteoarthritis, right knee: Secondary | ICD-10-CM

## 2018-09-03 DIAGNOSIS — Y999 Unspecified external cause status: Secondary | ICD-10-CM | POA: Diagnosis not present

## 2018-09-03 DIAGNOSIS — S8991XA Unspecified injury of right lower leg, initial encounter: Secondary | ICD-10-CM | POA: Diagnosis present

## 2018-09-03 DIAGNOSIS — Y9389 Activity, other specified: Secondary | ICD-10-CM | POA: Insufficient documentation

## 2018-09-03 DIAGNOSIS — Z79899 Other long term (current) drug therapy: Secondary | ICD-10-CM | POA: Diagnosis not present

## 2018-09-03 DIAGNOSIS — J45909 Unspecified asthma, uncomplicated: Secondary | ICD-10-CM | POA: Insufficient documentation

## 2018-09-03 DIAGNOSIS — S86911A Strain of unspecified muscle(s) and tendon(s) at lower leg level, right leg, initial encounter: Secondary | ICD-10-CM | POA: Insufficient documentation

## 2018-09-03 DIAGNOSIS — I10 Essential (primary) hypertension: Secondary | ICD-10-CM | POA: Insufficient documentation

## 2018-09-03 MED ORDER — HYDROCODONE-ACETAMINOPHEN 5-325 MG PO TABS
1.0000 | ORAL_TABLET | Freq: Once | ORAL | Status: AC
  Administered 2018-09-03: 20:00:00 1 via ORAL
  Filled 2018-09-03: qty 1

## 2018-09-03 MED ORDER — MELOXICAM 15 MG PO TABS: 15.0000 mg | ORAL_TABLET | Freq: Every day | ORAL | 0 refills | Status: DC

## 2018-09-03 NOTE — ED Triage Notes (Signed)
Pt to ED via POV c/o right leg and knee pain. Pt states that she got out of her car and something in her knee popped, pt states that she has not be able to walk on it without limping since then. Pt reports that she has hx/o knee problems.

## 2018-09-03 NOTE — ED Provider Notes (Signed)
[facility] Emergency Department Provider Note  ____________________________________________  Time seen: Approximately 6:57 PM  I have reviewed the triage vital signs and the nursing notes.   HISTORY  Chief Complaint Leg Pain and Knee Pain    HPI Deanna Vazquez is a 43 y.o. female who presents the emergency department complaining of right knee injury/pain.  Patient reports she was attempting to get out of her vehicle when she landed on both of her feet.  Patient reports that she felt/heard a pop to the right knee and has had pain since.  Knee did not give way.  No direct trauma to the knee.  Patient reports that she has been informed that she needs a knee replacement from significant osteoarthritis.  No other injury or complaint at this time.         Past Medical History:  Diagnosis Date  . Asthma   . Chronic headache   . Chronic knee pain   . Hypertension     Patient Active Problem List   Diagnosis Date Noted  . Essential hypertension 04/06/2017  . GAD (generalized anxiety disorder) 04/06/2017  . Sciatic mononeuropathy, right 04/06/2017  . Abnormal weight gain 04/06/2017    Past Surgical History:  Procedure Laterality Date  . CESAREAN SECTION      Prior to Admission medications   Medication Sig Start Date End Date Taking? Authorizing Provider  hydrochlorothiazide (HYDRODIURIL) 25 MG tablet Take 1 tablet (25 mg total) by mouth daily. 03/28/17   Carlean JewsBoscia, Heather E, NP  losartan (COZAAR) 50 MG tablet Take 1 tablet (50 mg total) by mouth daily. 10/07/17   Carlean JewsBoscia, Heather E, NP  meloxicam (MOBIC) 15 MG tablet Take 1 tablet (15 mg total) by mouth daily. 09/03/18   , Delorise RoyalsJonathan D, PA-C  omeprazole (PRILOSEC) 20 MG capsule Take 20 mg by mouth daily.    [provider]  traMADol (ULTRAM) 50 MG tablet Take 1 tablet (50 mg total) by mouth 3 (three) times daily. Take 2 tab 3 times daily. Patient taking differently: Take 50 mg by mouth 3  (three) times daily. Take 1 tab 3 times daily. 03/28/17   Carlean JewsBoscia, Heather E, NP  venlafaxine XR (EFFEXOR-XR) 75 MG 24 hr capsule Take 3 cap po daily 08/08/17   Carlean JewsBoscia, Heather E, NP    Allergies Penicillins  Family History  Problem Relation Age of Onset  . Hypertension Mother   . Diabetes Mother   . Hyperlipidemia Mother   . Cancer Mother   . Stroke Mother   . COPD Father     Social History Social History   Tobacco Use  . Smoking status: Current Every Day Smoker    Packs/day: 1.00    Types: Cigarettes  . Smokeless tobacco: Never Used  Substance Use Topics  . Alcohol use: No  . Drug use: No     Review of Systems  Constitutional: No fever/chills Eyes: No visual changes. No discharge ENT: No upper respiratory complaints. Cardiovascular: no chest pain. Respiratory: no cough. No SOB. Gastrointestinal: No abdominal pain.  No nausea, no vomiting.   Musculoskeletal: Positive for right knee pain/injury Skin: Negative for rash, abrasions, lacerations, ecchymosis. Neurological: Negative for headaches, focal weakness or numbness. 10-point ROS otherwise negative.  ____________________________________________   PHYSICAL EXAM:  VITAL SIGNS: ED Triage Vitals  Enc Vitals Group     BP 09/03/18 1712 (!) 153/97     Pulse Rate 09/03/18 1712 (!) 103     Resp 09/03/18 1712 16  Temp 09/03/18 1712 98.5 F (36.9 C)     Temp Source 09/03/18 1712 Oral     SpO2 09/03/18 1712 100 %     Weight 09/03/18 1706 258 lb (117 kg)     Height 09/03/18 1706 (!) 5" (0.127 m)     Head Circumference --      Peak Flow --      Pain Score 09/03/18 1705 10     Pain Loc --      Pain Edu? --      Excl. in [place]? --      Constitutional: Alert and oriented. Well appearing and in no acute distress. Eyes: Conjunctivae are normal. PERRL. EOMI. Head: Atraumatic. ENT:      Ears:       Nose: No congestion/rhinnorhea.      Mouth/Throat: Mucous membranes are moist.  Neck: No stridor.    Cardiovascular:  Normal rate, regular rhythm. Normal S1 and S2.  Good peripheral circulation. Respiratory: Normal respiratory effort without tachypnea or retractions. Lungs CTAB. Good air entry to the bases with no decreased or absent breath sounds. Musculoskeletal: Full range of motion to all extremities. No gross deformities appreciated.  Visualization of the right knee reveals no gross signs of trauma.  Patient is able to extend and flex the knee at this time.  Palpation along the quadriceps, hamstring reveals no deficits.  Patient is moderately tender to palpation over the insertion site of the hamstring, again with no deficits.  No other significant tenderness to the posterior knee.  Patient is moderately tender palpation along the medial joint line.  No ballottement.  Varus, valgus, Lachman's, McMurray's is negative.  Dorsalis pedis pulse intact distally.  Sensation intact distally. Neurologic:  Normal speech and language. No gross focal neurologic deficits are appreciated.  Skin:  Skin is warm, dry and intact. No rash noted. Psychiatric: Mood and affect are normal. Speech and behavior are normal. Patient exhibits appropriate insight and judgement.   ____________________________________________   LABS (all labs ordered are listed, but only abnormal results are displayed)  Labs Reviewed - No data to display ____________________________________________  EKG   ____________________________________________  RADIOLOGY I personally viewed and evaluated these images as part of my medical decision making, as well as reviewing the written report by the radiologist.  I concur with radiologist finding of no acute osseous abnormality to the knee.  Significant osteoarthritis.  Dg Knee Complete 4 Views Right  Result Date: 09/03/2018 CLINICAL DATA:  Knee pain, popping EXAM: RIGHT KNEE - COMPLETE 4+ VIEW COMPARISON:  09/21/2013 FINDINGS: No fracture or dislocation of the right knee. There is moderate to severe  tricompartmental joint space narrowing and osteophytosis, worst in the medial compartment and worsened when compared to examination dated 09/21/2013. No knee joint effusion. IMPRESSION: No fracture or dislocation of the right knee. There is moderate to severe tricompartmental joint space narrowing and osteophytosis, worst in the medial compartment and worsened when compared to examination dated 09/21/2013. No knee joint effusion. Electronically Signed   By: Eddie Candle M.D.   On: 09/03/2018 18:15    ____________________________________________    PROCEDURES  Procedure(s) performed:    Procedures    Medications  HYDROcodone-acetaminophen (NORCO/VICODIN) 5-325 MG per tablet 1 tablet (has no administration in time range)     ____________________________________________   INITIAL IMPRESSION / ASSESSMENT AND PLAN / ED COURSE  Pertinent labs & imaging results that were available during my care of the patient were reviewed by me and considered in my medical  decision making (see chart for details).  Review of the [place] CSRS was performed in accordance of the [facility] prior to dispensing any controlled drugs.           Patient's diagnosis is consistent with knee strain, arthritis of the right knee.  Patient presented to the emergency department with complaint of knee pain after getting out of a vehicle.  Overall, exam was reassuring.  No significant findings on physical exam.  X-ray feels no acute osseous abnormality.  Patient will be placed in knee immobilizer, crutches.  Patient is given prescription for meloxicam for symptom improvement.  Patient has significant osteoarthritis and is advised to follow-up with orthopedic surgery as they have already recommended knee replacement..  Patient is given ED precautions to return to the ED for any worsening or new symptoms.     ____________________________________________  FINAL CLINICAL IMPRESSION(S) / ED DIAGNOSES  Final diagnoses:  Strain  of right knee, initial encounter  Primary osteoarthritis of right knee      NEW MEDICATIONS STARTED DURING THIS VISIT:  ED Discharge Orders         Ordered    meloxicam (MOBIC) 15 MG tablet  Daily     09/03/18 1916              This chart was dictated using voice recognition software/Dragon. Despite best efforts to proofread, errors can occur which can change the meaning. Any change was purely unintentional.    Racheal PatchesCuthriell,  D, PA-C 09/03/18 1917    Jene EveryKinner, Robert, MD 09/03/18 2224

## 2020-04-26 IMAGING — DX RIGHT KNEE - COMPLETE 4+ VIEW
4 series · 4 of 4 positions shown · non-contrast
Comparison: 09/21/2013

CLINICAL DATA: Knee pain, popping

EXAM:
RIGHT KNEE - COMPLETE 4+ VIEW

[knee ap]
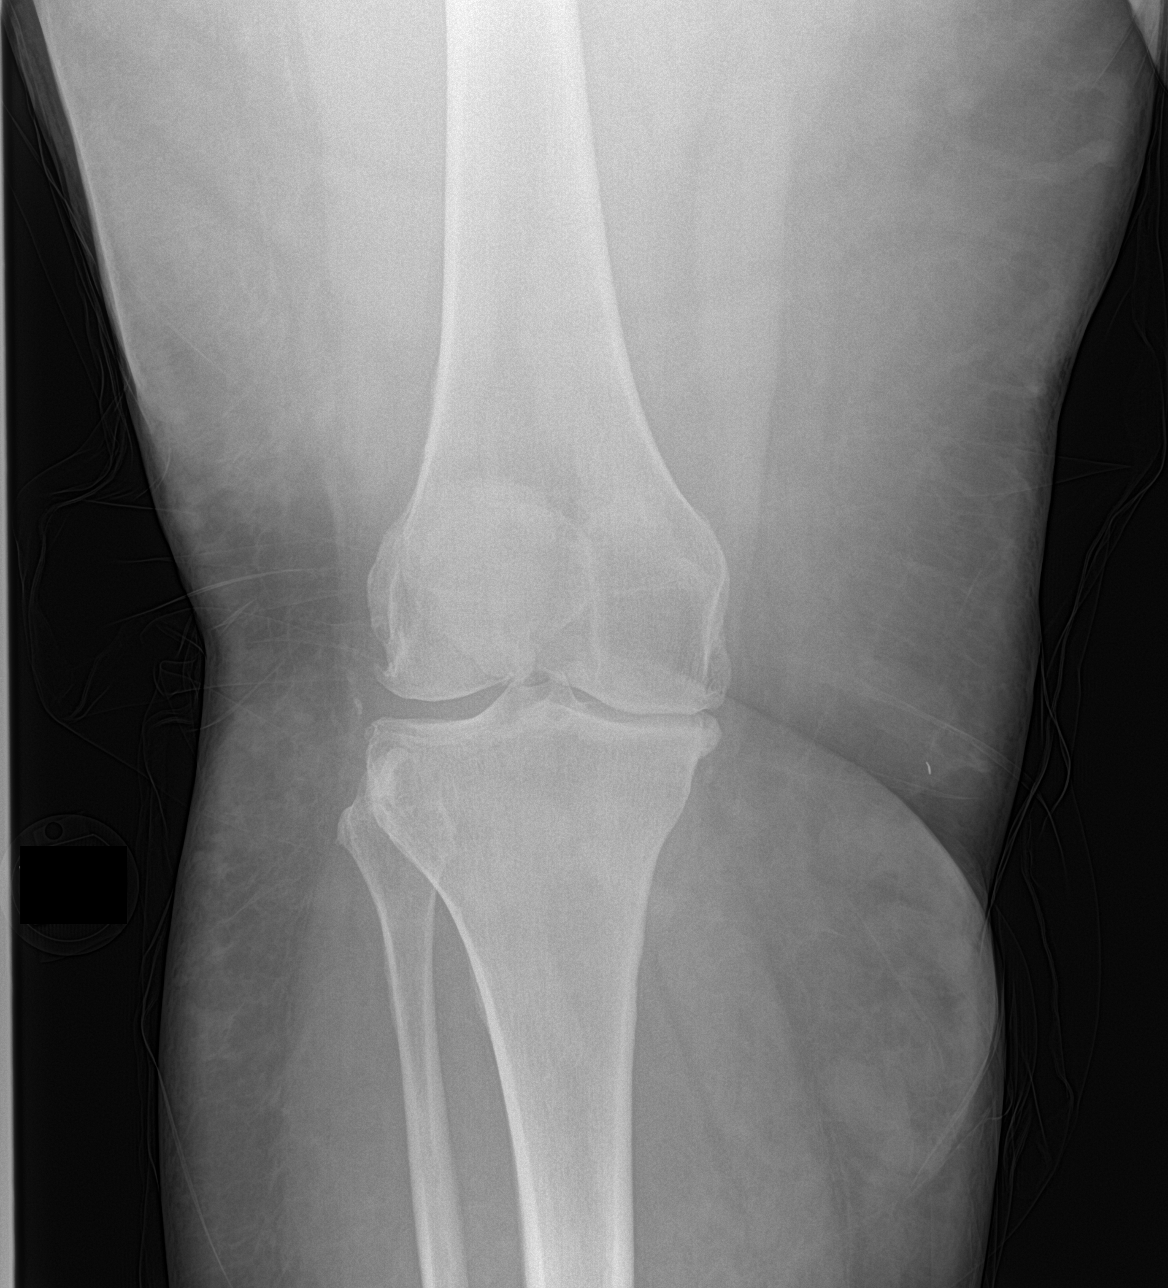

[knee lat]
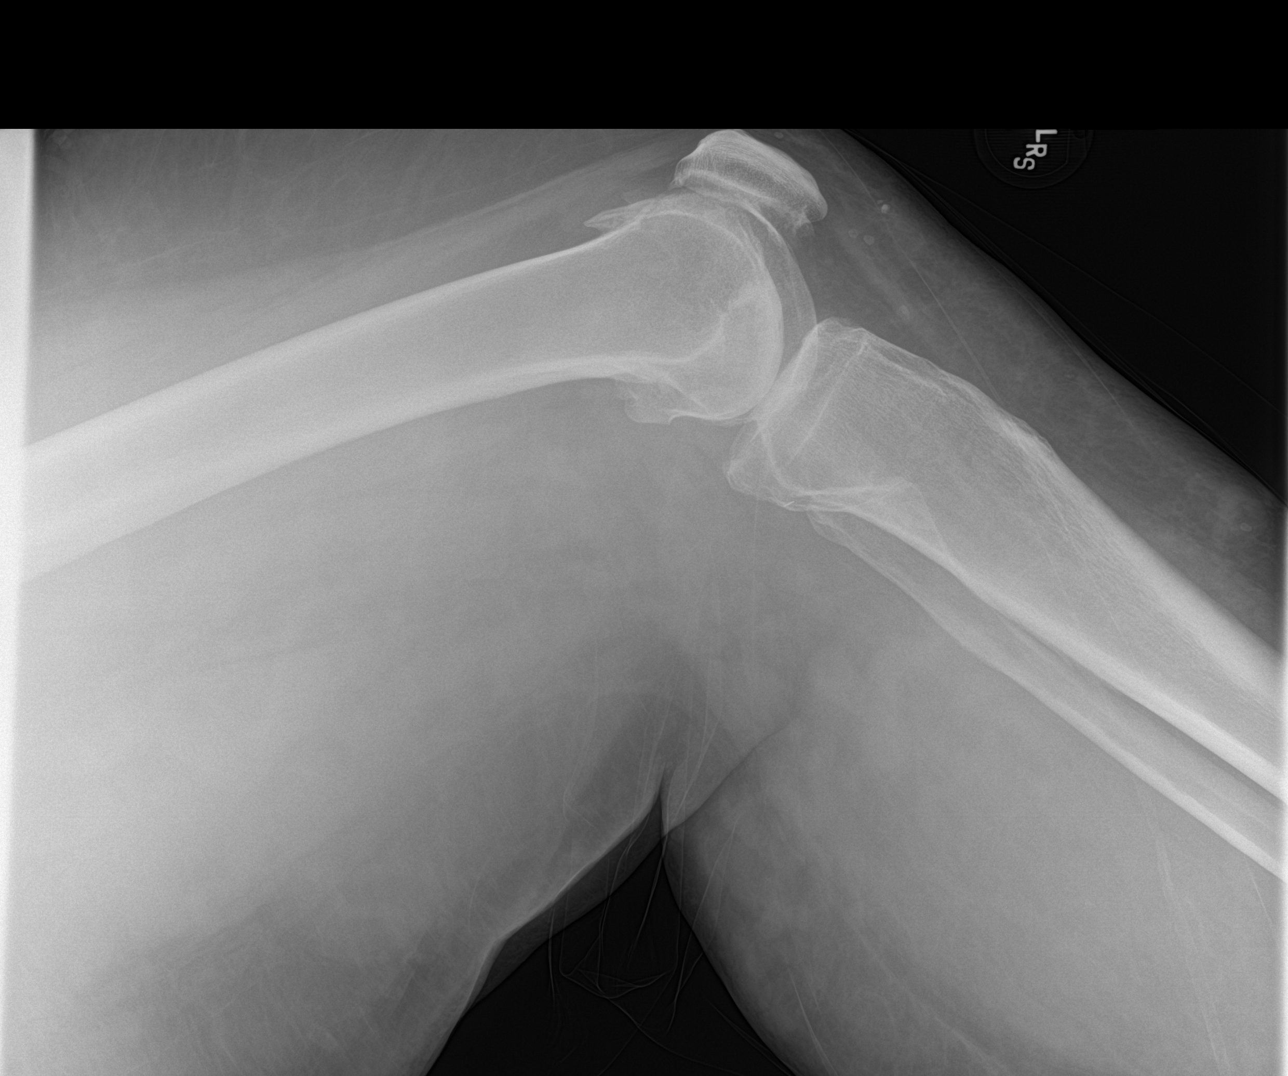

[knee obl (1 of 2)]
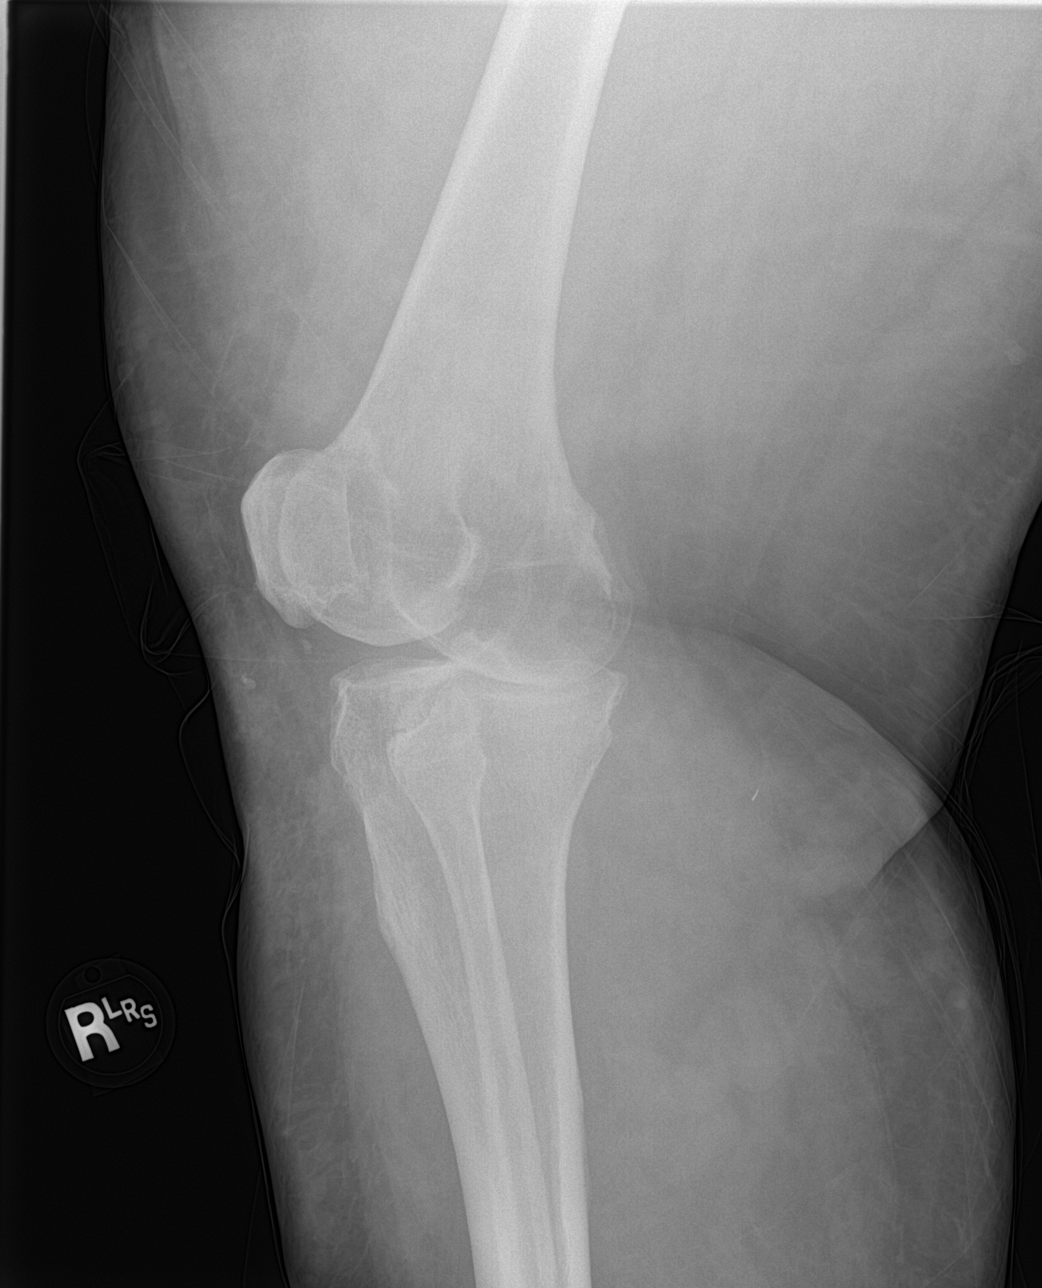

[knee obl (2 of 2)]
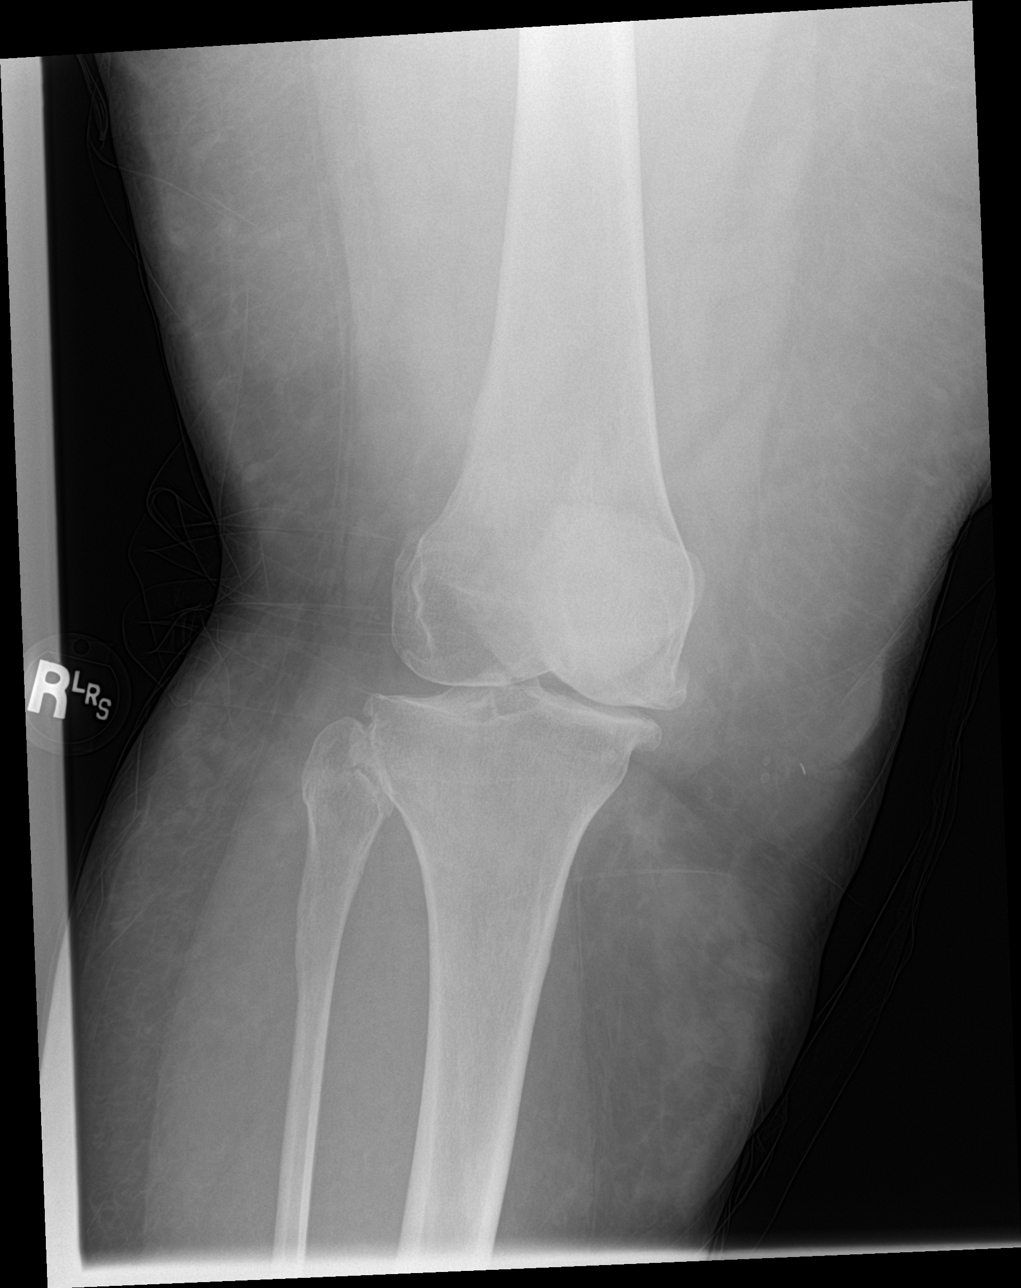

[4 of 4 positions shown; findings below may reference images not displayed]

FINDINGS: No fracture or dislocation of the right knee. There is moderate to
severe tricompartmental joint space narrowing and osteophytosis,
worst in the medial compartment and worsened when compared to
examination dated 09/21/2013. No knee joint effusion.
IMPRESSION: No fracture or dislocation of the right knee. There is moderate to
severe tricompartmental joint space narrowing and osteophytosis,
worst in the medial compartment and worsened when compared to
examination dated 09/21/2013. No knee joint effusion.

## 2021-09-01 ENCOUNTER — Telehealth: Payer: Commercial Managed Care - PPO | Admitting: Family Medicine

## 2021-09-01 DIAGNOSIS — B370 Candidal stomatitis: Secondary | ICD-10-CM | POA: Diagnosis not present

## 2021-09-01 MED ORDER — NYSTATIN 100000 UNIT/ML MT SUSP: 5.0000 mL | Freq: Four times a day (QID) | OROMUCOSAL | 0 refills | Status: DC

## 2021-09-01 NOTE — Patient Instructions (Signed)
Oral Thrush, Adult Oral thrush is an infection in your mouth and throat and on your tongue. It causes white patches to form in your mouth and on your tongue. Many cases of thrush are mild. But, sometimes, thrush can be serious. People who have a weak body defense system (immune system) or other diseases can be affected more. What are the causes? This condition is caused by a type of fungus called yeast. The fungus is normally present in small amounts in the mouth and nose. If a person has a long-term illness or a weak body defense system, the fungus can grow and spread quickly. This causes thrush. What increases the risk? You are more likely to develop this condition if: You have a weak body defense system. You are an older adult. You have diabetes, cancer, or HIV. You have a dry mouth. You are pregnant or breastfeeding. You do not take good care of your teeth. This risk is greater for people who have false teeth (dentures). You use antibiotic or steroid medicines. What are the signs or symptoms? Symptoms of this condition include: A burning feeling in the mouth and throat. White patches that stick to the mouth and tongue. A bad taste in the mouth or trouble tasting foods. A feeling like you have cotton in your mouth. Pain when you eat and swallow. Not wanting to eat as much as usual. Cracking at the corners of the mouth. How is this treated? This condition is treated with medicines called antifungals. These medicines prevent a fungus from growing. The medicines are either put right on the area (topical) or swallowed (oral). Your doctor will also treat other problems that you may have, such as diabetes or HIV. Follow these instructions at home: Helping with pain and soreness To lessen your pain: Drink cold liquids, like water and iced tea. Eat frozen ice pops or frozen juices. Eat foods that are easy to swallow, like gelatin and ice cream. Drink from a straw if you have too much pain  in your mouth.  General instructions Take or use over-the-counter and prescription medicines only as told by your doctor. Eat plain yogurt that has live cultures in it. Read the label to make sure that there are live cultures in your yogurt. If you wear false teeth: Take them out before you go to bed. Brush them well. Soak them in a cleaner. Rinse your mouth with warm salt-water many times a day. To make the salt-water mixture, dissolve -1 teaspoon (3-6 g) of salt in 1 cup (237 mL) of warm water. Contact a doctor if: Your problems do not get better within 7 days of treatment. Your infection is spreading. This may show as white areas on the skin outside of your mouth. You are nursing your baby and you have redness and pain in the nipples. Summary Oral thrush is an infection in your mouth and throat. It is caused by a fungus. You are more likely to get this condition if you have a weak body defense system. Diseases like diabetes, cancer, or HIV also add to your risk. This condition is treated with medicines called antifungals. Contact a doctor if you do not get better within 7 days of starting treatment. This information is not intended to replace advice given to you by your health care provider. Make sure you discuss any questions you have with your health care provider. Document Revised: 09/24/2020 Document Reviewed: 12/01/2018 Elsevier Patient Education  2023 [employer]  

## 2021-09-01 NOTE — Progress Notes (Signed)
Virtual Visit Consent   Demi D Schlottman, you are scheduled for a virtual visit with a [facility] provider today. Just as with appointments in the office, your consent must be obtained to participate. Your consent will be active for this visit and any virtual visit you may have with one of our providers in the next 365 days. If you have a MyChart account, a copy of this consent can be sent to you electronically.  As this is a virtual visit, video technology does not allow for your provider to perform a traditional examination. This may limit your provider's ability to fully assess your condition. If your provider identifies any concerns that need to be evaluated in person or the need to arrange testing (such as labs, EKG, etc.), we will make arrangements to do so. Although advances in technology are sophisticated, we cannot ensure that it will always work on either your end or our end. If the connection with a video visit is poor, the visit may have to be switched to a telephone visit. With either a video or telephone visit, we are not always able to ensure that we have a secure connection.  By engaging in this virtual visit, you consent to the provision of healthcare and authorize for your insurance to be billed (if applicable) for the services provided during this visit. Depending on your insurance coverage, you may receive a charge related to this service.  I need to obtain your verbal consent now. Are you willing to proceed with your visit today? Kazuko D Leckey has provided verbal consent on 09/01/2021 for a virtual visit (video or telephone). Freddy Finner, NP  Date: 09/01/2021 11:53 AM  Virtual Visit via Video Note   I, Freddy Finner, connected with  MATHEW STORCK  ([id number], 06-27-1975) on 09/01/21 at 11:45 AM EDT by a video-enabled telemedicine application and verified that I am speaking with the correct person using two identifiers.  Location: Patient: Virtual Visit Location  Patient: Home Provider: Virtual Visit Location Provider: Home Office   I discussed the limitations of evaluation and management by telemedicine and the availability of in person appointments. The patient expressed understanding and agreed to proceed.    History of Present Illness: Randell D Tesoro is a 46 y.o. who identifies as a female who was assigned female at birth, and is being seen today for tongue white coating and throat very red and dry and tender.  Per chart reviewed- has recently taken Augmentin for facial cellulitis secondary to an infection that happened post a tooth pulling.  Reports the last several days it has gotten worse. Reports jaw pain is improving and denies trouble with chewing or swallowing. Reports she still has some abx left to take.   Problems:  Patient Active Problem List   Diagnosis Date Noted   Essential hypertension 04/06/2017   GAD (generalized anxiety disorder) 04/06/2017   Sciatic mononeuropathy, right 04/06/2017   Abnormal weight gain 04/06/2017    Allergies:  Allergies  Allergen Reactions   Penicillins Nausea And Vomiting   Medications:  Current Outpatient Medications:    hydrochlorothiazide (HYDRODIURIL) 25 MG tablet, Take 1 tablet (25 mg total) by mouth daily., Disp: 30 tablet, Rfl: 3   losartan (COZAAR) 50 MG tablet, Take 1 tablet (50 mg total) by mouth daily., Disp: 30 tablet, Rfl: 3   meloxicam (MOBIC) 15 MG tablet, Take 1 tablet (15 mg total) by mouth daily., Disp: 30 tablet, Rfl: 0   omeprazole (PRILOSEC) 20 MG capsule,  Take 20 mg by mouth daily., Disp: , Rfl:    traMADol (ULTRAM) 50 MG tablet, Take 1 tablet (50 mg total) by mouth 3 (three) times daily. Take 2 tab 3 times daily. (Patient taking differently: Take 50 mg by mouth 3 (three) times daily. Take 1 tab 3 times daily.), Disp: 30 tablet, Rfl: 2   venlafaxine XR (EFFEXOR-XR) 75 MG 24 hr capsule, Take 3 cap po daily, Disp: 90 capsule, Rfl: 1  Observations/Objective: Patient is  well-developed, well-nourished in no acute distress.  Resting comfortably  at home.  Head is normocephalic, atraumatic.  No labored breathing.  Speech is clear and coherent with logical content.  Patient is alert and oriented at baseline.  Noted white coating on sides of tongue- redness in center  Assessment and Plan: 1. Thrush  - nystatin (MYCOSTATIN) 100000 UNIT/ML suspension; Take 5 mLs (500,000 Units total) by mouth 4 (four) times daily. For 5-7 days  Dispense: 437 mL; Refill: 0  -medication induced thrush treated with above; advised follow up if not improved or if symptoms worsen.   Reviewed side effects, risks and benefits of medication.    Patient acknowledged agreement and understanding of the plan.   Past Medical, Surgical, Social History, Allergies, and Medications have been Reviewed.   Follow Up Instructions: I discussed the assessment and treatment plan with the patient. The patient was provided an opportunity to ask questions and all were answered. The patient agreed with the plan and demonstrated an understanding of the instructions.  A copy of instructions were sent to the patient via MyChart unless otherwise noted below.     The patient was advised to call back or seek an in-person evaluation if the symptoms worsen or if the condition fails to improve as anticipated.  Time:  I spent  10 minutes with the patient via telehealth technology discussing the above problems/concerns.    Freddy Finner, NP

## 2022-03-19 ENCOUNTER — Other Ambulatory Visit: Payer: Self-pay | Admitting: Internal Medicine

## 2022-03-19 DIAGNOSIS — G4709 Other insomnia: Secondary | ICD-10-CM

## 2022-04-04 ENCOUNTER — Other Ambulatory Visit: Payer: Self-pay | Admitting: Internal Medicine

## 2022-04-04 DIAGNOSIS — F418 Other specified anxiety disorders: Secondary | ICD-10-CM

## 2022-04-16 ENCOUNTER — Other Ambulatory Visit: Payer: Self-pay | Admitting: Internal Medicine

## 2022-04-16 DIAGNOSIS — G4709 Other insomnia: Secondary | ICD-10-CM

## 2022-04-18 ENCOUNTER — Other Ambulatory Visit: Payer: Self-pay | Admitting: Internal Medicine

## 2022-04-18 DIAGNOSIS — G4709 Other insomnia: Secondary | ICD-10-CM

## 2022-04-18 DIAGNOSIS — F418 Other specified anxiety disorders: Secondary | ICD-10-CM

## 2022-04-18 DIAGNOSIS — E668 Other obesity: Secondary | ICD-10-CM

## 2022-04-20 ENCOUNTER — Ambulatory Visit: Payer: Commercial Managed Care - PPO | Admitting: Internal Medicine

## 2022-04-23 ENCOUNTER — Other Ambulatory Visit: Payer: Self-pay

## 2022-04-23 DIAGNOSIS — E668 Other obesity: Secondary | ICD-10-CM

## 2022-04-23 MED ORDER — WEGOVY 0.25 MG/0.5ML ~~LOC~~ SOAJ: SUBCUTANEOUS | 0 refills | Status: DC

## 2022-04-26 ENCOUNTER — Other Ambulatory Visit: Payer: Self-pay | Admitting: Internal Medicine

## 2022-04-26 DIAGNOSIS — I10 Essential (primary) hypertension: Secondary | ICD-10-CM

## 2022-04-30 ENCOUNTER — Other Ambulatory Visit: Payer: Self-pay

## 2022-04-30 DIAGNOSIS — F418 Other specified anxiety disorders: Secondary | ICD-10-CM

## 2022-04-30 MED ORDER — VENLAFAXINE HCL ER 75 MG PO CP24: 75.0000 mg | ORAL_CAPSULE | Freq: Every day | ORAL | 1 refills | Status: DC

## 2022-05-03 ENCOUNTER — Other Ambulatory Visit: Payer: Self-pay | Admitting: Internal Medicine

## 2022-05-03 DIAGNOSIS — F418 Other specified anxiety disorders: Secondary | ICD-10-CM

## 2022-05-04 ENCOUNTER — Ambulatory Visit: Payer: Commercial Managed Care - PPO | Admitting: Internal Medicine

## 2022-05-04 ENCOUNTER — Encounter: Payer: Self-pay | Admitting: Internal Medicine

## 2022-05-04 VITALS — BP 142/90 | HR 94 | Temp 98.2°F | Ht 60.0 in | Wt 313.4 lb

## 2022-05-04 DIAGNOSIS — G5701 Lesion of sciatic nerve, right lower limb: Secondary | ICD-10-CM

## 2022-05-04 DIAGNOSIS — F411 Generalized anxiety disorder: Secondary | ICD-10-CM

## 2022-05-04 DIAGNOSIS — K219 Gastro-esophageal reflux disease without esophagitis: Secondary | ICD-10-CM | POA: Diagnosis not present

## 2022-05-04 DIAGNOSIS — I10 Essential (primary) hypertension: Secondary | ICD-10-CM

## 2022-05-04 DIAGNOSIS — F5101 Primary insomnia: Secondary | ICD-10-CM

## 2022-05-04 MED ORDER — PREDNISONE 50 MG PO TABS: 50.0000 mg | ORAL_TABLET | Freq: Every day | ORAL | 0 refills | Status: AC

## 2022-05-04 MED ORDER — TRAZODONE HCL 100 MG PO TABS: 100.0000 mg | ORAL_TABLET | Freq: Every day | ORAL | 2 refills | Status: DC

## 2022-05-04 MED ORDER — SUCRALFATE 1 G PO TABS: 1.0000 g | ORAL_TABLET | Freq: Four times a day (QID) | ORAL | 2 refills | Status: DC

## 2022-05-04 NOTE — Progress Notes (Signed)
Established Patient Office Visit  Subjective:  Patient ID: Deanna Vazquez, female    DOB: 05/12/1975  Age: 47 y.o. MRN: [id number]  Chief Complaint  Patient presents with   Follow-up    Check up med refill    C/o left buttock pain that radiates down the back of her leg to the top of her foot which is quite severe, went to [facility] and was prescribed medrol dose pak. Also c/o insomnia unrelieved by current dose of Trazodone.    No other concerns at this time.   Past Medical History:  Diagnosis Date   Asthma    Chronic headache    Chronic knee pain    Hypertension     Past Surgical History:  Procedure Laterality Date   CESAREAN SECTION      Social History   Socioeconomic History   Marital status: Divorced    Spouse name: Not on file   Number of children: Not on file   Years of education: Not on file   Highest education level: Not on file  Occupational History   Not on file  Tobacco Use   Smoking status: Every Day    Packs/day: 1    Types: Cigarettes   Smokeless tobacco: Never  Vaping Use   Vaping Use: Never used  Substance and Sexual Activity   Alcohol use: No   Drug use: No   Sexual activity: Not on file  Other Topics Concern   Not on file  Social History Narrative   Not on file   Social Determinants of Health   Financial Resource Strain: Not on file  Food Insecurity: Not on file  Transportation Needs: Not on file  Physical Activity: Not on file  Stress: Not on file  Social Connections: Not on file  Intimate Partner Violence: Not on file    Family History  Problem Relation Age of Onset   Hypertension Mother    Diabetes Mother    Hyperlipidemia Mother    Cancer Mother    Stroke Mother    COPD Father     Allergies  Allergen Reactions   Penicillins Nausea And Vomiting    Review of Systems  All other systems reviewed and are negative.      Objective:   BP (!) 142/90   Pulse 94   Temp 98.2 F (36.8 C) (Tympanic)   Ht 5' (1.524  m)   Wt (!) 313 lb 6.4 oz (142.2 kg)   SpO2 97%   BMI 61.21 kg/m   Vitals:   05/04/22 1438  BP: (!) 142/90  Pulse: 94  Temp: 98.2 F (36.8 C)  Height: 5' (1.524 m)  Weight: (!) 313 lb 6.4 oz (142.2 kg)  SpO2: 97%  TempSrc: Tympanic  BMI (Calculated): 61.21    Physical Exam Vitals reviewed.  Constitutional:      General: She is not in acute distress.    Appearance: She is obese.  HENT:     Head: Normocephalic.     Nose: Nose normal.     Mouth/Throat:     Mouth: Mucous membranes are moist.  Eyes:     Extraocular Movements: Extraocular movements intact.     Pupils: Pupils are equal, round, and reactive to light.  Cardiovascular:     Rate and Rhythm: Normal rate and regular rhythm.     Heart sounds: No murmur heard. Pulmonary:     Effort: Pulmonary effort is normal.     Breath sounds: No rhonchi or rales.  Abdominal:     General: Abdomen is flat.     Palpations: There is no hepatomegaly, splenomegaly or mass.  Musculoskeletal:        General: Normal range of motion.     Cervical back: Normal range of motion. No tenderness.  Skin:    General: Skin is warm and dry.  Neurological:     General: No focal deficit present.     Mental Status: She is alert and oriented to person, place, and time.     Cranial Nerves: No cranial nerve deficit.     Motor: No weakness.  Psychiatric:        Mood and Affect: Mood normal.        Behavior: Behavior normal.      No results found for any visits on 05/04/22.  No results found for this or any previous visit (from the past 2160 hour(Carrington Mullenax)).    Assessment & Plan:   Problem List Items Addressed This Visit       Cardiovascular and Mediastinum   Essential hypertension     Nervous and Auditory   Sciatic mononeuropathy, right - Primary   Relevant Medications   predniSONE (DELTASONE) 50 MG tablet   traZODone (DESYREL) 100 MG tablet   Other Relevant Orders   Ambulatory referral to Pain Clinic     Other   GAD (generalized  anxiety disorder)   Relevant Medications   traZODone (DESYREL) 100 MG tablet   Other Visit Diagnoses     Gastroesophageal reflux disease without esophagitis       Relevant Medications   pantoprazole (PROTONIX) 40 MG tablet   sucralfate (CARAFATE) 1 g tablet   Primary insomnia       Relevant Medications   traZODone (DESYREL) 100 MG tablet       No follow-ups on file.   Total time spent: 30 minutes  Volanda Napoleon, MD  05/04/2022

## 2022-05-14 ENCOUNTER — Other Ambulatory Visit: Payer: Self-pay | Admitting: Internal Medicine

## 2022-05-14 DIAGNOSIS — F418 Other specified anxiety disorders: Secondary | ICD-10-CM

## 2022-05-17 ENCOUNTER — Other Ambulatory Visit: Payer: Self-pay | Admitting: Nurse Practitioner

## 2022-05-17 ENCOUNTER — Other Ambulatory Visit: Payer: Self-pay

## 2022-05-17 DIAGNOSIS — F418 Other specified anxiety disorders: Secondary | ICD-10-CM

## 2022-05-17 DIAGNOSIS — F5101 Primary insomnia: Secondary | ICD-10-CM

## 2022-05-17 MED ORDER — VENLAFAXINE HCL ER 75 MG PO CP24: 75.0000 mg | ORAL_CAPSULE | Freq: Every day | ORAL | 1 refills | Status: DC

## 2022-05-17 MED ORDER — TRAZODONE HCL 100 MG PO TABS
100.0000 mg | ORAL_TABLET | Freq: Every day | ORAL | 2 refills | Status: DC
Start: 2022-05-17 — End: 2022-11-22

## 2022-05-17 MED ORDER — VENLAFAXINE HCL ER 75 MG PO CP24
ORAL_CAPSULE | ORAL | 1 refills | Status: DC
Start: 2022-05-17 — End: 2022-11-22

## 2022-05-22 ENCOUNTER — Other Ambulatory Visit: Payer: Self-pay | Admitting: Internal Medicine

## 2022-05-22 DIAGNOSIS — E668 Other obesity: Secondary | ICD-10-CM

## 2022-05-22 DIAGNOSIS — F418 Other specified anxiety disorders: Secondary | ICD-10-CM

## 2022-05-22 DIAGNOSIS — K922 Gastrointestinal hemorrhage, unspecified: Secondary | ICD-10-CM

## 2022-05-28 ENCOUNTER — Telehealth: Payer: Self-pay

## 2022-05-28 NOTE — Telephone Encounter (Signed)
Patient called requesting double dose of the rx for her weightloss injection wegovy

## 2022-05-31 ENCOUNTER — Other Ambulatory Visit: Payer: Self-pay | Admitting: Nurse Practitioner

## 2022-05-31 DIAGNOSIS — F418 Other specified anxiety disorders: Secondary | ICD-10-CM

## 2022-06-14 ENCOUNTER — Ambulatory Visit: Payer: Commercial Managed Care - PPO | Admitting: Internal Medicine

## 2022-08-03 ENCOUNTER — Ambulatory Visit: Payer: Commercial Managed Care - PPO | Admitting: Internal Medicine

## 2022-08-04 ENCOUNTER — Ambulatory Visit: Payer: Commercial Managed Care - PPO | Admitting: Anesthesiology

## 2022-08-31 ENCOUNTER — Other Ambulatory Visit: Payer: Self-pay | Admitting: Nurse Practitioner

## 2022-08-31 DIAGNOSIS — K922 Gastrointestinal hemorrhage, unspecified: Secondary | ICD-10-CM

## 2022-09-06 ENCOUNTER — Encounter (HOSPITAL_COMMUNITY): Payer: Self-pay

## 2022-09-06 ENCOUNTER — Emergency Department: Payer: Commercial Managed Care - PPO

## 2022-09-06 ENCOUNTER — Observation Stay
Admission: EM | Admit: 2022-09-06 | Discharge: 2022-09-07 | Disposition: A | Discharge status: Other Acute Inpatient Hospital | Payer: Commercial Managed Care - PPO | Attending: Family Medicine | Admitting: Family Medicine

## 2022-09-06 ENCOUNTER — Other Ambulatory Visit: Payer: Self-pay

## 2022-09-06 DIAGNOSIS — K819 Cholecystitis, unspecified: Secondary | ICD-10-CM | POA: Diagnosis not present

## 2022-09-06 DIAGNOSIS — R1011 Right upper quadrant pain: Secondary | ICD-10-CM | POA: Diagnosis present

## 2022-09-06 DIAGNOSIS — I10 Essential (primary) hypertension: Secondary | ICD-10-CM | POA: Diagnosis not present

## 2022-09-06 DIAGNOSIS — K8050 Calculus of bile duct without cholangitis or cholecystitis without obstruction: Secondary | ICD-10-CM | POA: Diagnosis not present

## 2022-09-06 DIAGNOSIS — F411 Generalized anxiety disorder: Secondary | ICD-10-CM | POA: Diagnosis present

## 2022-09-06 DIAGNOSIS — F32A Depression, unspecified: Secondary | ICD-10-CM | POA: Diagnosis not present

## 2022-09-06 DIAGNOSIS — K8042 Calculus of bile duct with acute cholecystitis without obstruction: Principal | ICD-10-CM | POA: Diagnosis present

## 2022-09-06 DIAGNOSIS — R079 Chest pain, unspecified: Secondary | ICD-10-CM | POA: Insufficient documentation

## 2022-09-06 DIAGNOSIS — K760 Fatty (change of) liver, not elsewhere classified: Secondary | ICD-10-CM | POA: Diagnosis not present

## 2022-09-06 DIAGNOSIS — R110 Nausea: Secondary | ICD-10-CM | POA: Diagnosis not present

## 2022-09-06 DIAGNOSIS — D649 Anemia, unspecified: Secondary | ICD-10-CM | POA: Diagnosis not present

## 2022-09-06 DIAGNOSIS — K219 Gastro-esophageal reflux disease without esophagitis: Secondary | ICD-10-CM | POA: Diagnosis not present

## 2022-09-06 DIAGNOSIS — M1990 Unspecified osteoarthritis, unspecified site: Secondary | ICD-10-CM | POA: Diagnosis not present

## 2022-09-06 LAB — CBC
HCT: 38.4 % (ref 36.0–46.0)
Hemoglobin: 11.7 g/dL — ABNORMAL LOW (ref 12.0–15.0)
MCH: 25.1 pg — ABNORMAL LOW (ref 26.0–34.0)
MCHC: 30.5 g/dL (ref 30.0–36.0)
MCV: 82.2 fL (ref 80.0–100.0)
Platelets: 347 10*3/uL (ref 150–400)
RBC: 4.67 MIL/uL (ref 3.87–5.11)
RDW: 14.4 % (ref 11.5–15.5)
WBC: 10.8 10*3/uL — ABNORMAL HIGH (ref 4.0–10.5)
nRBC: 0 % (ref 0.0–0.2)

## 2022-09-06 LAB — BASIC METABOLIC PANEL
Anion gap: 8 (ref 5–15)
BUN: 14 mg/dL (ref 6–20)
CO2: 29 mmol/L (ref 22–32)
Calcium: 8.6 mg/dL — ABNORMAL LOW (ref 8.9–10.3)
Chloride: 102 mmol/L (ref 98–111)
Creatinine, Ser: 0.82 mg/dL (ref 0.44–1.00)
GFR, Estimated: >60 mL/min (ref >60)
Glucose, Bld: 104 mg/dL — ABNORMAL HIGH (ref 70–99)
Potassium: 3.6 mmol/L (ref 3.5–5.1)
Sodium: 139 mmol/L (ref 135–145)

## 2022-09-06 LAB — HEPATIC FUNCTION PANEL
ALT: 27 U/L (ref 0–44)
AST: 17 U/L (ref 15–41)
Albumin: 3.4 g/dL — ABNORMAL LOW (ref 3.5–5.0)
Alkaline Phosphatase: 70 U/L (ref 38–126)
Bilirubin, Direct: <0.1 mg/dL (ref 0.0–0.2)
Total Bilirubin: 0.2 mg/dL — ABNORMAL LOW (ref 0.3–1.2)
Total Protein: 6.5 g/dL (ref 6.5–8.1)

## 2022-09-06 LAB — TROPONIN I (HIGH SENSITIVITY): Troponin I (High Sensitivity): 7 ng/L (ref <18)

## 2022-09-06 MED ORDER — MORPHINE SULFATE (PF) 2 MG/ML IV SOLN: 2.0000 mg | INTRAVENOUS | Status: DC | PRN

## 2022-09-06 MED ORDER — MORPHINE SULFATE (PF) 4 MG/ML IV SOLN
4.0000 mg | Freq: Once | INTRAVENOUS | Status: AC
  Administered 2022-09-06: 4 mg via INTRAVENOUS
  Filled 2022-09-06: qty 1

## 2022-09-06 MED ORDER — HYDROMORPHONE HCL 1 MG/ML IJ SOLN
1.0000 mg | Freq: Once | INTRAMUSCULAR | Status: AC
  Administered 2022-09-06: 1 mg via INTRAVENOUS
  Filled 2022-09-06: qty 1

## 2022-09-06 MED ORDER — POTASSIUM CHLORIDE IN NACL 20-0.9 MEQ/L-% IV SOLN
INTRAVENOUS | Status: DC
  Filled 2022-09-06: qty 1000

## 2022-09-06 MED ORDER — ONDANSETRON HCL 4 MG/2ML IJ SOLN: 4.0000 mg | Freq: Four times a day (QID) | INTRAMUSCULAR | Status: DC | PRN

## 2022-09-06 MED ORDER — ONDANSETRON HCL 4 MG/2ML IJ SOLN
4.0000 mg | Freq: Once | INTRAMUSCULAR | Status: AC
  Administered 2022-09-06: 4 mg via INTRAVENOUS
  Filled 2022-09-06: qty 2

## 2022-09-06 NOTE — ED Notes (Signed)
First Nurse Note: Patient to ED via [facility] from home for CP x3 days- radiates to back. Also having N/V and dizziness. Hx of GERD. EMS gave 2 nitroglycerin sprays and 324 aspirin. 20 L AC

## 2022-09-06 NOTE — ED Triage Notes (Signed)
Pt here with cp x3 days. Pt states pain is centered radiating to her back. Pt also having dizziness and SOB. 2 nitroglycerin sprays and 4 of zofran. 18G LAC    170/110 118-cbg 96% RA

## 2022-09-06 NOTE — ED Provider Notes (Signed)
-----------------------------------------   10:28 PM on 09/06/2022 ----------------------------------------- Patient care assumed from Dr. Cyril Loosen.  Patient's ultrasound has resulted showing what appears to be choledocholithiasis with cholecystitis at least 2 stones within the common bile duct which is dilated.  Reassuringly LFTs are normal and white blood cell count is 10.8.  Unfortunately we do not have a GI physician at our facility who could perform the ERCP.  Discussed with Dr. Joneen Roach at [facility] who will be admitting to her service for further workup treatment.  Patient has been updated currently awaiting bed assignment and transport.   Minna Antis, MD 09/06/22 2229

## 2022-09-06 NOTE — ED Provider Notes (Signed)
[facility] Provider Note    Event Date/Time   First MD Initiated Contact with Patient 09/06/22 1720     (approximate)   History   Chest Pain   HPI  Deanna Vazquez is a 47 y.o. female with a history of generalized anxiety disorder, hypertension who presents with complaints of upper abdominal pain.  Initially this was described as chest pain to triage nurse however pain seems to be primarily in the right upper quadrant.  She reports the pain is severe and does radiate to her right shoulder blade.  No history of abdominal surgeries besides C-section.     Physical Exam   Triage Vital Signs: ED Triage Vitals  Encounter Vitals Group     BP 09/06/22 1703 (!) 155/84     Systolic BP Percentile --      Diastolic BP Percentile --      Pulse Rate 09/06/22 1702 83     Resp 09/06/22 1702 (!) 22     Temp 09/06/22 1702 98.4 F (36.9 C)     Temp Source 09/06/22 1702 Oral     SpO2 09/06/22 1702 97 %     Weight 09/06/22 1658 (!) 142.2 kg (313 lb 7.9 oz)     Height 09/06/22 1658 1.524 m (5')     Head Circumference --      Peak Flow --      Pain Score 09/06/22 1658 10     Pain Loc --      Pain Education --      Exclude from Growth Chart --     Most recent vital signs: Vitals:   09/06/22 1702 09/06/22 1703  BP:  (!) 155/84  Pulse: 83   Resp: (!) 22   Temp: 98.4 F (36.9 C)   SpO2: 97%      General: Awake, no distress.  CV:  Good peripheral perfusion.  Resp:  Normal effort.  Abd:  No distention.  Significant tenderness in the right upper quadrant Other:     ED Results / Procedures / Treatments   Labs (all labs ordered are listed, but only abnormal results are displayed) Labs Reviewed  BASIC METABOLIC PANEL - Abnormal; Notable for the following components:      Result Value   Glucose, Bld 104 (*)    Calcium 8.6 (*)    All other components within normal limits  CBC - Abnormal; Notable for the following components:   WBC 10.8 (*)     Hemoglobin 11.7 (*)    MCH 25.1 (*)    All other components within normal limits  HEPATIC FUNCTION PANEL - Abnormal; Notable for the following components:   Albumin 3.4 (*)    Total Bilirubin 0.2 (*)    All other components within normal limits  POC URINE PREG, ED  TROPONIN I (HIGH SENSITIVITY)     EKG  ED ECG REPORT I, Jene Every, the attending physician, personally viewed and interpreted this ECG.  Date: 09/06/2022  Rhythm: normal sinus rhythm QRS Axis: normal Intervals: normal ST/T Wave abnormalities: normal Narrative Interpretation: no evidence of acute ischemia    RADIOLOGY Chest x-ray viewed interpret by me, no acute abnormality    PROCEDURES:  Critical Care performed:        Procedures   MEDICATIONS ORDERED IN ED: Medications  morphine (PF) 4 MG/ML injection 4 mg (has no administration in time range)  morphine (PF) 4 MG/ML injection 4 mg (4 mg Intravenous Given 09/06/22 1831)  ondansetron (  ZOFRAN) injection 4 mg (4 mg Intravenous Given 09/06/22 1832)     IMPRESSION / MDM / ASSESSMENT AND PLAN / ED COURSE  I reviewed the triage vital signs and the nursing notes. Patient's presentation is most consistent with acute presentation with potential threat to life or bodily function.  Patient presents with upper abdominal pain as detailed above.  Primarily in the right upper quadrant, highly suspicious for cholecystitis versus cholelithiasis.  Differential also includes pancreatitis, does not appear consistent with ACS, reassuring EKG, normal high sensitive troponin.  With IV morphine, IV Zofran, obtain ultrasound of the right upper quadrant and reevaluate.  LFTs are normal.  Ultrasound consistent with acute cholecystitis, also likely common bile duct stone, did discuss with Dr. Tonna Boehringer who notes that this will need to be transferred because no ERCP GI doc available this week  Patient has requested transfer [facility]      FINAL CLINICAL IMPRESSION(S) / ED  DIAGNOSES   Final diagnoses:  Cholecystitis     Rx / DC Orders   ED Discharge Orders     None        Note:  This document was prepared using Dragon voice recognition software and may include unintentional dictation errors.   Jene Every, MD 09/06/22 2026

## 2022-09-07 ENCOUNTER — Inpatient Hospital Stay (HOSPITAL_COMMUNITY): Payer: Commercial Managed Care - PPO

## 2022-09-07 ENCOUNTER — Encounter (HOSPITAL_COMMUNITY): Payer: Self-pay | Admitting: Internal Medicine

## 2022-09-07 ENCOUNTER — Inpatient Hospital Stay (HOSPITAL_COMMUNITY)
Admission: RE | Admit: 2022-09-07 | Discharge: 2022-09-11 | DRG: 418 | Disposition: A | Discharge status: Home / Self Care | Payer: Commercial Managed Care - PPO | Attending: Internal Medicine | Admitting: Internal Medicine

## 2022-09-07 DIAGNOSIS — J45909 Unspecified asthma, uncomplicated: Secondary | ICD-10-CM | POA: Diagnosis present

## 2022-09-07 DIAGNOSIS — K5900 Constipation, unspecified: Secondary | ICD-10-CM | POA: Diagnosis present

## 2022-09-07 DIAGNOSIS — Z83438 Family history of other disorder of lipoprotein metabolism and other lipidemia: Secondary | ICD-10-CM | POA: Diagnosis not present

## 2022-09-07 DIAGNOSIS — Z833 Family history of diabetes mellitus: Secondary | ICD-10-CM | POA: Diagnosis not present

## 2022-09-07 DIAGNOSIS — E669 Obesity, unspecified: Secondary | ICD-10-CM | POA: Diagnosis present

## 2022-09-07 DIAGNOSIS — I10 Essential (primary) hypertension: Secondary | ICD-10-CM | POA: Diagnosis present

## 2022-09-07 DIAGNOSIS — K8050 Calculus of bile duct without cholangitis or cholecystitis without obstruction: Principal | ICD-10-CM | POA: Diagnosis present

## 2022-09-07 DIAGNOSIS — K219 Gastro-esophageal reflux disease without esophagitis: Secondary | ICD-10-CM | POA: Diagnosis present

## 2022-09-07 DIAGNOSIS — F40240 Claustrophobia: Secondary | ICD-10-CM | POA: Diagnosis present

## 2022-09-07 DIAGNOSIS — K819 Cholecystitis, unspecified: Secondary | ICD-10-CM | POA: Diagnosis not present

## 2022-09-07 DIAGNOSIS — D649 Anemia, unspecified: Secondary | ICD-10-CM | POA: Diagnosis not present

## 2022-09-07 DIAGNOSIS — M1990 Unspecified osteoarthritis, unspecified site: Secondary | ICD-10-CM | POA: Diagnosis present

## 2022-09-07 DIAGNOSIS — E8809 Other disorders of plasma-protein metabolism, not elsewhere classified: Secondary | ICD-10-CM | POA: Diagnosis present

## 2022-09-07 DIAGNOSIS — R1011 Right upper quadrant pain: Secondary | ICD-10-CM | POA: Diagnosis present

## 2022-09-07 DIAGNOSIS — K8040 Calculus of bile duct with cholecystitis, unspecified, without obstruction: Secondary | ICD-10-CM | POA: Diagnosis not present

## 2022-09-07 DIAGNOSIS — Z79899 Other long term (current) drug therapy: Secondary | ICD-10-CM | POA: Diagnosis not present

## 2022-09-07 DIAGNOSIS — Z823 Family history of stroke: Secondary | ICD-10-CM | POA: Diagnosis not present

## 2022-09-07 DIAGNOSIS — Z8249 Family history of ischemic heart disease and other diseases of the circulatory system: Secondary | ICD-10-CM

## 2022-09-07 DIAGNOSIS — F17210 Nicotine dependence, cigarettes, uncomplicated: Secondary | ICD-10-CM | POA: Diagnosis present

## 2022-09-07 DIAGNOSIS — G8929 Other chronic pain: Secondary | ICD-10-CM | POA: Diagnosis present

## 2022-09-07 DIAGNOSIS — K8062 Calculus of gallbladder and bile duct with acute cholecystitis without obstruction: Secondary | ICD-10-CM | POA: Diagnosis present

## 2022-09-07 DIAGNOSIS — K760 Fatty (change of) liver, not elsewhere classified: Secondary | ICD-10-CM

## 2022-09-07 DIAGNOSIS — D638 Anemia in other chronic diseases classified elsewhere: Secondary | ICD-10-CM | POA: Diagnosis present

## 2022-09-07 DIAGNOSIS — Z6841 Body Mass Index (BMI) 40.0 and over, adult: Secondary | ICD-10-CM | POA: Diagnosis not present

## 2022-09-07 DIAGNOSIS — K660 Peritoneal adhesions (postprocedural) (postinfection): Secondary | ICD-10-CM | POA: Diagnosis present

## 2022-09-07 DIAGNOSIS — E876 Hypokalemia: Secondary | ICD-10-CM | POA: Diagnosis present

## 2022-09-07 DIAGNOSIS — Z7985 Long-term (current) use of injectable non-insulin antidiabetic drugs: Secondary | ICD-10-CM

## 2022-09-07 DIAGNOSIS — K8042 Calculus of bile duct with acute cholecystitis without obstruction: Secondary | ICD-10-CM

## 2022-09-07 DIAGNOSIS — F32A Depression, unspecified: Secondary | ICD-10-CM | POA: Diagnosis present

## 2022-09-07 DIAGNOSIS — Z825 Family history of asthma and other chronic lower respiratory diseases: Secondary | ICD-10-CM

## 2022-09-07 DIAGNOSIS — R110 Nausea: Secondary | ICD-10-CM | POA: Diagnosis not present

## 2022-09-07 DIAGNOSIS — K821 Hydrops of gallbladder: Secondary | ICD-10-CM | POA: Diagnosis present

## 2022-09-07 LAB — COMPREHENSIVE METABOLIC PANEL
ALT: 26 U/L (ref 0–44)
AST: 15 U/L (ref 15–41)
Albumin: 2.8 g/dL — ABNORMAL LOW (ref 3.5–5.0)
Alkaline Phosphatase: 69 U/L (ref 38–126)
Anion gap: 10 (ref 5–15)
BUN: 8 mg/dL (ref 6–20)
CO2: 26 mmol/L (ref 22–32)
Calcium: 8.5 mg/dL — ABNORMAL LOW (ref 8.9–10.3)
Chloride: 102 mmol/L (ref 98–111)
Creatinine, Ser: 0.76 mg/dL (ref 0.44–1.00)
GFR, Estimated: >60 mL/min (ref >60)
Glucose, Bld: 96 mg/dL (ref 70–99)
Potassium: 3.8 mmol/L (ref 3.5–5.1)
Sodium: 138 mmol/L (ref 135–145)
Total Bilirubin: 0.3 mg/dL (ref 0.3–1.2)
Total Protein: 6 g/dL — ABNORMAL LOW (ref 6.5–8.1)

## 2022-09-07 LAB — RETICULOCYTES
Immature Retic Fract: 20.7 % — ABNORMAL HIGH (ref 2.3–15.9)
RBC.: 4.38 MIL/uL (ref 3.87–5.11)
Retic Count, Absolute: 61.3 10*3/uL (ref 19.0–186.0)
Retic Ct Pct: 1.4 % (ref 0.4–3.1)

## 2022-09-07 LAB — CBC WITH DIFFERENTIAL/PLATELET
Abs Immature Granulocytes: 0.03 10*3/uL (ref 0.00–0.07)
Basophils Absolute: 0 10*3/uL (ref 0.0–0.1)
Basophils Relative: 0 %
Eosinophils Absolute: 0.3 10*3/uL (ref 0.0–0.5)
Eosinophils Relative: 3 %
HCT: 35.5 % — ABNORMAL LOW (ref 36.0–46.0)
Hemoglobin: 10.8 g/dL — ABNORMAL LOW (ref 12.0–15.0)
Immature Granulocytes: 0 %
Lymphocytes Relative: 22 %
Lymphs Abs: 2.1 10*3/uL (ref 0.7–4.0)
MCH: 24.7 pg — ABNORMAL LOW (ref 26.0–34.0)
MCHC: 30.4 g/dL (ref 30.0–36.0)
MCV: 81.1 fL (ref 80.0–100.0)
Monocytes Absolute: 0.8 10*3/uL (ref 0.1–1.0)
Monocytes Relative: 9 %
Neutro Abs: 6.3 10*3/uL (ref 1.7–7.7)
Neutrophils Relative %: 66 %
Platelets: 260 10*3/uL (ref 150–400)
RBC: 4.38 MIL/uL (ref 3.87–5.11)
RDW: 14.1 % (ref 11.5–15.5)
WBC: 9.5 10*3/uL (ref 4.0–10.5)
nRBC: 0 % (ref 0.0–0.2)

## 2022-09-07 LAB — LIPASE, BLOOD: Lipase: 27 U/L (ref 11–51)

## 2022-09-07 LAB — FOLATE: Folate: 8.4 ng/mL (ref >5.9)

## 2022-09-07 LAB — PROTIME-INR
INR: 1.1 (ref 0.8–1.2)
Prothrombin Time: 13.9 seconds (ref 11.4–15.2)

## 2022-09-07 LAB — IRON AND TIBC
Iron: 23 ug/dL — ABNORMAL LOW (ref 28–170)
Saturation Ratios: 7 % — ABNORMAL LOW (ref 10.4–31.8)
TIBC: 342 ug/dL (ref 250–450)
UIBC: 319 ug/dL

## 2022-09-07 LAB — VITAMIN B12: Vitamin B-12: 1367 pg/mL — ABNORMAL HIGH (ref 180–914)

## 2022-09-07 LAB — FERRITIN: Ferritin: 22 ng/mL (ref 11–307)

## 2022-09-07 LAB — MAGNESIUM: Magnesium: 1.8 mg/dL (ref 1.7–2.4)

## 2022-09-07 MED ORDER — HYDROMORPHONE HCL 1 MG/ML IJ SOLN
0.5000 mg | INTRAMUSCULAR | Status: DC | PRN
  Administered 2022-09-07 – 2022-09-08 (×4): 0.5 mg via INTRAVENOUS
  Filled 2022-09-07 (×4): qty 0.5

## 2022-09-07 MED ORDER — SODIUM CHLORIDE 0.9 % IV SOLN
2.0000 g | Freq: Every day | INTRAVENOUS | Status: DC
  Administered 2022-09-07 – 2022-09-10 (×4): 2 g via INTRAVENOUS
  Filled 2022-09-07 (×4): qty 20

## 2022-09-07 MED ORDER — DEXTROSE IN LACTATED RINGERS 5 % IV SOLN: INTRAVENOUS | Status: DC

## 2022-09-07 MED ORDER — ONDANSETRON HCL 4 MG/2ML IJ SOLN
4.0000 mg | Freq: Four times a day (QID) | INTRAMUSCULAR | Status: DC | PRN
  Administered 2022-09-08: 4 mg via INTRAVENOUS
  Filled 2022-09-07: qty 2

## 2022-09-07 MED ORDER — VENLAFAXINE HCL ER 75 MG PO CP24
225.0000 mg | ORAL_CAPSULE | Freq: Every day | ORAL | Status: DC
  Administered 2022-09-08 – 2022-09-11 (×4): 225 mg via ORAL
  Filled 2022-09-07 (×4): qty 1

## 2022-09-07 MED ORDER — LORAZEPAM 1 MG PO TABS
2.0000 mg | ORAL_TABLET | Freq: Once | ORAL | Status: AC
  Administered 2022-09-07: 2 mg via ORAL
  Filled 2022-09-07: qty 2

## 2022-09-07 MED ORDER — ACETAMINOPHEN 650 MG RE SUPP: 650.0000 mg | Freq: Four times a day (QID) | RECTAL | Status: DC | PRN

## 2022-09-07 MED ORDER — SODIUM CHLORIDE 0.9 % IV BOLUS
500.0000 mL | Freq: Once | INTRAVENOUS | Status: AC
  Administered 2022-09-07: 500 mL via INTRAVENOUS

## 2022-09-07 MED ORDER — KETOROLAC TROMETHAMINE 15 MG/ML IJ SOLN
15.0000 mg | Freq: Once | INTRAMUSCULAR | Status: AC
  Administered 2022-09-07: 15 mg via INTRAVENOUS
  Filled 2022-09-07: qty 1

## 2022-09-07 MED ORDER — METRONIDAZOLE 500 MG/100ML IV SOLN
500.0000 mg | Freq: Two times a day (BID) | INTRAVENOUS | Status: DC
  Administered 2022-09-07 – 2022-09-10 (×8): 500 mg via INTRAVENOUS
  Filled 2022-09-07 (×8): qty 100

## 2022-09-07 MED ORDER — LACTATED RINGERS IV SOLN: INTRAVENOUS | Status: AC

## 2022-09-07 MED ORDER — PANTOPRAZOLE SODIUM 40 MG IV SOLR
40.0000 mg | INTRAVENOUS | Status: DC
  Administered 2022-09-07 – 2022-09-10 (×4): 40 mg via INTRAVENOUS
  Filled 2022-09-07 (×4): qty 10

## 2022-09-07 MED ORDER — NALOXONE HCL 0.4 MG/ML IJ SOLN: 0.4000 mg | INTRAMUSCULAR | Status: DC | PRN

## 2022-09-07 MED ORDER — ACETAMINOPHEN 325 MG PO TABS: 650.0000 mg | ORAL_TABLET | Freq: Four times a day (QID) | ORAL | Status: DC | PRN

## 2022-09-07 MED ORDER — SUCRALFATE 1 G PO TABS
1.0000 g | ORAL_TABLET | Freq: Four times a day (QID) | ORAL | Status: DC
  Administered 2022-09-07 – 2022-09-11 (×14): 1 g via ORAL
  Filled 2022-09-07 (×14): qty 1

## 2022-09-07 MED ORDER — TRAZODONE HCL 50 MG PO TABS
100.0000 mg | ORAL_TABLET | Freq: Every day | ORAL | Status: DC
  Administered 2022-09-07 – 2022-09-10 (×4): 100 mg via ORAL
  Filled 2022-09-07 (×4): qty 2

## 2022-09-07 MED ORDER — CARIPRAZINE HCL 1.5 MG PO CAPS
1.5000 mg | ORAL_CAPSULE | Freq: Every day | ORAL | Status: DC
  Administered 2022-09-07 – 2022-09-11 (×5): 1.5 mg via ORAL
  Filled 2022-09-07 (×5): qty 1

## 2022-09-07 MED ORDER — DIPHENHYDRAMINE HCL 50 MG/ML IJ SOLN
12.5000 mg | Freq: Once | INTRAMUSCULAR | Status: AC
  Administered 2022-09-07: 12.5 mg via INTRAVENOUS
  Filled 2022-09-07: qty 1

## 2022-09-07 MED ORDER — PROCHLORPERAZINE EDISYLATE 10 MG/2ML IJ SOLN
10.0000 mg | Freq: Once | INTRAMUSCULAR | Status: AC
  Administered 2022-09-07: 10 mg via INTRAVENOUS
  Filled 2022-09-07: qty 2

## 2022-09-07 NOTE — Progress Notes (Signed)
Dr. Lowell Guitar reached out this evening to expedite this patient's care.  Appreciate the call.  GI recommended MRCP to confirm choledocholithiasis, as her LFTs have been normal.  Patient has claustrophobia and was unable to complete MRCP.  GI team recommended proceeding with laparoscopic cholecystectomy with IOC.  Please keep patient NPO at midnight for possible surgery or procedure tomorrow.  Please recheck LFTs in the morning.  I will discuss with Dr. Janee Morn who is running the emergency general surgery service. Will need to decide whether to proceed with cholecystectomy with IOC or ERCP.  The ultrasound is pretty convincing for choledocholithiasis, see below.  May be better to address the choledocholithiasis prior to cholecystectomy.    Quentin Ore, MD General, Bariatric and Minimally Invasive Surgery [facility] - [facility]

## 2022-09-07 NOTE — H&P (Signed)
History and Physical      Deanna Vazquez KGM:[id number] DOB: May 20, 1975 DOA: 09/07/2022; DOS: 09/07/2022  PCP: Carlean Jews, NP  Patient coming from: home [facility] ED  I have personally briefly reviewed patient's old medical records in [facility]  Chief Complaint: Abdominal pain  HPI: Deanna Vazquez is a 47 y.o. female with medical history significant for essential pretension, depression, GERD, who is admitted to [facility] on 09/07/2022 by way of transfer from [facility] ED with choledocholithiasis after presenting from home to [facility] ED complaining of abdominal pain.   The patient reports 2 days of sharp right upper quadrant abdominal discomfort with radiation into the right shoulder blade.  She notes exacerbation of this discomfort with palpation over the right upper quadrant of the abdomen as well as in and immediately postprandial timeframe.  She is also noted some associated intermittent nausea in the absence of any vomiting.  Denies any associated diarrhea, melena, or hematochezia.  Denies any associated subjective fever, chills, rigors, generalized myalgias.  No recent dysuria or gross hematuria.  No rash.  No jaundice.  No recent chest pain, shortness of breath, palpitations, diaphoresis, dizziness, presyncope, or syncope.  No recent orthopnea, PND, or worsening of peripheral edema.  Not on any blood thinners as an outpatient, including aspirin.     [facility] ED Course:  Vital signs in the ED were notable for the following: Afebrile; heart rates in the 80s to low 100s; systolic pressures in the low 644I to 160s; respiratory rate 17-22, oxygen saturation 94 to 98% on room air.  Labs were notable for the following: CMP notable for the following: Sodium 136, bicarbonate 29, creatinine 0.82, glucose 104, calcium, adjusted for mild hypoalbuminemia noted to be 9.0, abdomen 3.4, alkaline phosphatase 70, total bilirubin 0.2, AST 17, ALT 27.  High-sensitivity troponin I noted to be 7.   CBC notable for white cell count 2800, hemoglobin 11.7, platelet count 347.  Per my interpretation, EKG in ED demonstrated the following: EKG showed sinus rhythm with sinus arrhythmia, rate 84, normal intervals, and no evidence of T wave ST changes, including evidence of ST ovation.  Imaging in the ED, per corresponding formal radiology read, was notable for the following: 2 view chest x-ray shows no evidence of acute cardiopulmonary process, Cleen evidence of allergy, edema, effusion, or pneumothorax.  Reports ultrasound shows common bile duct dilated to 10.9 mm and at least 2 shadowing stones within the common bile duct, measuring 6 and 7 mm, also showing evidence of cholelithiasis and gallbladder sludge with some evidence of wall thickening.   While in the ED, the following were administered: Morphine 4 mg IV x 2 doses, Zofran 4 mg IV x 1.  [facility] does not have ERCP capabilities at this time.   Subsequently, the patient was admitted to [facility] for further evaluation management of presenting choledocholithiasis.     Review of Systems: As per HPI otherwise 10 point review of systems negative.   Past Medical History:  Diagnosis Date   Asthma    Chronic headache    Chronic knee pain    Hypertension     Past Surgical History:  Procedure Laterality Date   CESAREAN SECTION      Social History:  reports that she has been smoking cigarettes. She has never used smokeless tobacco. She reports that she does not drink alcohol and does not use drugs.   Allergies  Allergen Reactions   Penicillins Nausea And Vomiting  Family History  Problem Relation Age of Onset   Hypertension Mother    Diabetes Mother    Hyperlipidemia Mother    Cancer Mother    Stroke Mother    COPD Father     Family history reviewed and not pertinent    Prior to Admission medications   Medication Sig Start Date End Date Taking? Authorizing Provider  diclofenac (VOLTAREN) 75 MG EC tablet Take 75 mg by  mouth 2 (two) times daily.    [provider]  gabapentin (NEURONTIN) 300 MG capsule Take 1 capsule by mouth 3 (three) times daily. 04/19/22   [provider]  nystatin (MYCOSTATIN) 100000 UNIT/ML suspension Take 5 mLs (500,000 Units total) by mouth 4 (four) times daily. For 5-7 days Patient not taking: Reported on 09/06/2022 09/01/21   Freddy Finner, NP  olmesartan-hydrochlorothiazide (BENICAR HCT) 40-25 MG tablet TAKE 1 TABLET BY MOUTH EVERY DAY IN THE MORNING 04/26/22   Sherron Monday, MD  pantoprazole (PROTONIX) 40 MG tablet TAKE 1 TABLET BY MOUTH EVERY DAY IN THE MORNING 08/31/22   Sherron Monday, MD  Semaglutide-Weight Management [facility]) 0.25 MG/0.5ML SOAJ INJECT ONE PER EACH WEEK 05/24/22   Orson Eva, NP  sucralfate (CARAFATE) 1 g tablet Take 1 tablet (1 g total) by mouth 4 (four) times daily. 05/04/22 08/02/22  Sherron Monday, MD  traZODone (DESYREL) 100 MG tablet Take 1 tablet (100 mg total) by mouth at bedtime. 05/17/22 09/06/22  Orson Eva, NP  UBRELVY 50 MG TABS Take by mouth as directed.    [provider]  venlafaxine XR (EFFEXOR-XR) 75 MG 24 hr capsule TAKE 3 CAPSULES BY MOUTH EVERY DAY 05/17/22   Orson Eva, NP  VRAYLAR 1.5 MG capsule Take 1.5 mg by mouth daily.    [provider]     Objective    Physical Exam: There were no vitals filed for this visit.  General: appears to be stated age; alert, oriented Skin: warm, dry, no rash Head:  AT/[place] Mouth:  Oral mucosa membranes appear dry, normal dentition Neck: supple; trachea midline Heart:  RRR; did not appreciate any M/R/G Lungs: CTAB, did not appreciate any wheezes, rales, or rhonchi Abdomen: + BS; soft, ND, mild tenderness to palpation over the right upper quadrant, in the absence of any associated guarding, rigidity, or rebound tenderness Vascular: 2+ pedal pulses b/l; 2+ radial pulses b/l Extremities: no peripheral edema, no muscle wasting Neuro: strength and  sensation intact in upper and lower extremities b/l     Labs on Admission: I have personally reviewed following labs and imaging studies  CBC: Recent Labs  Lab 09/06/22 1710 09/07/22 0108  WBC 10.8* 11.0*  NEUTROABS  --  8.5*  HGB 11.7* 11.0*  HCT 38.4 35.9*  MCV 82.2 82.0  PLT 347 295   Basic Metabolic Panel: Recent Labs  Lab 09/06/22 1710  NA 139  K 3.6  CL 102  CO2 29  GLUCOSE 104*  BUN 14  CREATININE 0.82  CALCIUM 8.6*   GFR: Estimated Creatinine Clearance: 110.3 mL/min (by C-G formula based on SCr of 0.82 mg/dL). Liver Function Tests: Recent Labs  Lab 09/06/22 1710  AST 17  ALT 27  ALKPHOS 70  BILITOT 0.2*  PROT 6.5  ALBUMIN 3.4*   No results for input(s): "LIPASE", "AMYLASE" in the last 168 hours. No results for input(s): "AMMONIA" in the last 168 hours. Coagulation Profile: No results for input(s): "INR", "PROTIME" in the last 168 hours. Cardiac Enzymes: No results  for input(s): "CKTOTAL", "CKMB", "CKMBINDEX", "TROPONINI" in the last 168 hours. BNP (last 3 results) No results for input(s): "PROBNP" in the last 8760 hours. HbA1C: No results for input(s): "HGBA1C" in the last 72 hours. CBG: No results for input(s): "GLUCAP" in the last 168 hours. Lipid Profile: No results for input(s): "CHOL", "HDL", "LDLCALC", "TRIG", "CHOLHDL", "LDLDIRECT" in the last 72 hours. Thyroid Function Tests: No results for input(s): "TSH", "T4TOTAL", "FREET4", "T3FREE", "THYROIDAB" in the last 72 hours. Anemia Panel: No results for input(s): "VITAMINB12", "FOLATE", "FERRITIN", "TIBC", "IRON", "RETICCTPCT" in the last 72 hours. Urine analysis:    Component Value Date/Time   COLORURINE Straw 11/16/2013 2110   COLORURINE YELLOW 01/22/2010 2232   APPEARANCEUR Clear 11/16/2013 2110   LABSPEC 1.005 11/16/2013 2110   PHURINE 6.0 11/16/2013 2110   PHURINE 5.5 01/22/2010 2232   GLUCOSEU Negative 11/16/2013 2110   HGBUR Negative 11/16/2013 2110   HGBUR NEGATIVE  01/22/2010 2232   BILIRUBINUR Negative 11/16/2013 2110   KETONESUR Negative 11/16/2013 2110   KETONESUR NEGATIVE 01/22/2010 2232   PROTEINUR Negative 11/16/2013 2110   PROTEINUR NEGATIVE 01/22/2010 2232   UROBILINOGEN 0.2 01/22/2010 2232   NITRITE Negative 11/16/2013 2110   NITRITE NEGATIVE 01/22/2010 2232   LEUKOCYTESUR Negative 11/16/2013 2110    Radiological Exams on Admission: US Abdomen Limited RUQ (LIVER/GB)  Result Date: 09/06/2022 CLINICAL DATA:  Upper abdominal pain EXAM: ULTRASOUND ABDOMEN LIMITED RIGHT UPPER QUADRANT COMPARISON:  None Available. FINDINGS: Gallbladder: Multiple shadowing stones. Gallbladder sludge. Increased wall thickness up to 9.4 mm with trace pericholecystic fluid. Positive sonographic Murphy. Common bile duct: Diameter: 10.9 mm. At least 2 shadowing stones within the common bile duct, these measure 6 and 7 mm. Liver: Slightly echogenic liver parenchyma. No focal hepatic abnormality. Portal vein is patent on color Doppler imaging with normal direction of blood flow towards the liver. Other: None. IMPRESSION: 1. Cholelithiasis with gallbladder wall thickening and positive sonographic Murphy, findings consistent with acute cholecystitis. 2. Dilated common bile duct with at least 2 stones within the common bile duct consistent with choledocholithiasis. 3. Slightly echogenic liver parenchyma consistent with steatosis. Electronically Signed   By: Jasmine Pang M.D.   On: 09/06/2022 20:48   DG Chest 2 View  Result Date: 09/06/2022 CLINICAL DATA:  Chest pain. EXAM: CHEST - 2 VIEW COMPARISON:  December 11, 2015 FINDINGS: The heart size and mediastinal contours are within normal limits. Both lungs are clear. Multilevel degenerative changes are seen involving the mid and lower thoracic spine. IMPRESSION: No active cardiopulmonary disease. Electronically Signed   By: Aram Candela M.D.   On: 09/06/2022 19:05      Assessment/Plan   Principal Problem:    Choledocholithiasis Active Problems:   Essential hypertension   Right upper quadrant abdominal pain   Nausea   Depression   Osteoarthritis   GERD (gastroesophageal reflux disease)       #) Choledocholithiasis: 2 days of right quadrant discomfort with prandial exacerbation and associate with nausea , right quadrant ultrasound shows evidence of choledocholithiasis in the form of 2 show awaiting stones within the common bile duct measuring 6 and 7 mm, cyst with common bile duct dilation to 10.9 mm, will also showing cholelithiasis and gallbladder sludge with some bladder wall thickening.   No evidence of fever or jaundice to suggest ascending cholangitis.  Additionally, no evidence of altered mental status or hypotension.  In the absence of objective fever and in the absence of leukocytosis, SIRS criteria are absent.  She appears hemodynamically stable.  Additionally, there is no evidence of associated transaminitis, including no evidence of elevation in bilirubin or alkaline phosphatase to suggest a cholestatic pattern.  Not on any blood thinners as an outpatient, including no aspirin.   Chales Abrahams Score for this patient in the context of anticipated aforementioned surgery conveys a  0.09% perioperative risk for significant cardiac event. No evidence to suggest acutely decompensated heart failure or acute MI. Consequently, no absolute contraindications to proceeding with proposed surgery at this time.  Plan: I have contacted on-call [facility] (Dr. Lynann Bologna) requesting consult for the morning of 7/30.  NPO.  Continuous lactated Ringer's at 100 cc/h.  As needed IV Zofran.  As needed IV Dilaudid.  Check INR.  CMP, CBC in the morning.                    #) Essential Hypertension: documented h/o such, with outpatient antihypertensive regimen including HCTZ, olmesartan.  SBP's in the ED today: Low 100s to 160s mmHg.   Plan: Close monitoring of subsequent BP via routine VS. in the  setting of current n.p.o. status, will hold home antihypertensive medications for now.                   #) Depression: documented h/o such. On gabapentin, Effexor, and cariprazine as outpatient.    Plan:  hold home gabapentin, Effexor, and cariprazine  for now in the setting of current n.p.o. status.             #) osteoarthritis: Documented history of such, on scheduled oral Voltaren as an outpatient.  Plan: In the context of current n.p.o. status, will hold him oral Voltaren.  Repeat CMP in the morning.                #) GERD: documented h/o such; on Protonix as outpatient.   Plan: In the setting of current n.p.o. status, will translate convert her home daily oral Protonix to daily IV Protonix.       DVT prophylaxis: SCD's   Code Status: Full code Family Communication: none Disposition Plan: Per Rounding Team Consults called: I have contacted on-call [facility] (Dr. Lynann Bologna) requesting consult for the morning of 7/30.;  Admission status: inpatient     I SPENT GREATER THAN 75  MINUTES IN CLINICAL CARE TIME/MEDICAL DECISION-MAKING IN COMPLETING THIS ADMISSION.      Chaney Born Minie Roadcap DO [facility]  From 7PM - 7AM   09/07/2022, 3:37 AM

## 2022-09-07 NOTE — Progress Notes (Signed)
MRI MRCP attempted. Pt extremely claustrophobic. Meds were given prior to her coming down. When we attempted to advance her into the scanner she panicked and stated that she would need to be "completely knocked out" in order to complete the exam.

## 2022-09-07 NOTE — Plan of Care (Signed)
  Problem: Activity: Goal: Risk for activity intolerance will decrease Outcome: Progressing   Problem: Coping: Goal: Level of anxiety will decrease Outcome: Progressing   Problem: Safety: Goal: Ability to remain free from injury will improve Outcome: Progressing   Problem: Skin Integrity: Goal: Risk for impaired skin integrity will decrease Outcome: Progressing   

## 2022-09-07 NOTE — ED Notes (Signed)
Patient belongings are in her possession for tranport.1 hospital bag with her purse and crocs.

## 2022-09-07 NOTE — Consult Note (Signed)
Consultation  Referring Provider:   [facility] Primary Care Physician:  Carlean Jews, NP Primary Gastroenterologist:  Gentry Fitz       Reason for Consultation:    Right upper quadrant abdominal pain possible choledocholithiasis DOA: 09/07/2022         Hospital Day: 1         HPI:   Deanna Vazquez is a 47 y.o. female with past medical history significant for HTN, asthma, obesity .   Presents to the ER as transfer from [facility] for RUQ AB pain, AB [place] concern for choledocholithiasis.   Work up notable for WBC 11, hemoglobin 10.8 normal MCV normal platelets, AST 15, ALT 26, alk phos 69, total bilirubin 0.3, albumin 2.8 Right upper quadrant ultrasound shows cholelithiasis wall thickening and positive Murphy consistent acute cholecystitis.  Dilated common bile duct at least 2 stones common bile duct consistent with choledocholithiasis, steatosis kidney function, INR 1.1  No family was present at the time of my evaluation. Patient states she is never had a history like this before, daughter with history of gallbladder removal, mom with history of colon cancer age of 66.  States she had a remote EGD colonoscopy 15+ years ago at [facility] Patient states 4 days ago she felt she had gas pains right upper quadrant underneath her ribs and radiating to her back with associated nausea and vomiting.  Denies fever, chills.  Had some shortness of breath with it no chest pain.  Not had any weight loss. Last bowel movement 5 days ago has struggled with constipation, has had some scant bright red blood in her stool. She has had some reflux on Protonix daily.  She does take BC powders a pack lasts her about 6 days.  She does smoke cigarettes, denies drinking or drug use.  Abnormal ED labs: Abnormal Labs Reviewed  CBC WITH DIFFERENTIAL/PLATELET - Abnormal; Notable for the following components:      Result Value   Hemoglobin 10.8 (*)    HCT 35.5 (*)    MCH 24.7 (*)    All other components within normal  limits  COMPREHENSIVE METABOLIC PANEL - Abnormal; Notable for the following components:   Calcium 8.5 (*)    Total Protein 6.0 (*)    Albumin 2.8 (*)    All other components within normal limits    Past Medical History:  Diagnosis Date   Asthma    Chronic headache    Chronic knee pain    Hypertension     Surgical History:  She  has a past surgical history that includes Cesarean section. Family History:  Her family history includes COPD in her father; Cancer in her mother; Diabetes in her mother; Hyperlipidemia in her mother; Hypertension in her mother; Stroke in her mother. Social History:   reports that she has been smoking cigarettes. She has never used smokeless tobacco. She reports that she does not drink alcohol and does not use drugs.  Prior to Admission medications   Medication Sig Start Date End Date Taking? Authorizing Provider  diclofenac (VOLTAREN) 75 MG EC tablet Take 75 mg by mouth 2 (two) times daily.    [provider]  gabapentin (NEURONTIN) 300 MG capsule Take 1 capsule by mouth 3 (three) times daily. 04/19/22   [provider]  nystatin (MYCOSTATIN) 100000 UNIT/ML suspension Take 5 mLs (500,000 Units total) by mouth 4 (four) times daily. For 5-7 days Patient not taking: Reported on 09/06/2022 09/01/21   Tereasa Coop  M, NP  olmesartan-hydrochlorothiazide (BENICAR HCT) 40-25 MG tablet TAKE 1 TABLET BY MOUTH EVERY DAY IN THE MORNING 04/26/22   Sherron Monday, MD  pantoprazole (PROTONIX) 40 MG tablet TAKE 1 TABLET BY MOUTH EVERY DAY IN THE MORNING 08/31/22   Sherron Monday, MD  Semaglutide-Weight Management [facility]) 0.25 MG/0.5ML SOAJ INJECT ONE PER EACH WEEK 05/24/22   Orson Eva, NP  sucralfate (CARAFATE) 1 g tablet Take 1 tablet (1 g total) by mouth 4 (four) times daily. 05/04/22 08/02/22  Sherron Monday, MD  traZODone (DESYREL) 100 MG tablet Take 1 tablet (100 mg total) by mouth at bedtime. 05/17/22 09/06/22  Orson Eva, NP  UBRELVY 50  MG TABS Take by mouth as directed.    [provider]  venlafaxine XR (EFFEXOR-XR) 75 MG 24 hr capsule TAKE 3 CAPSULES BY MOUTH EVERY DAY 05/17/22   Orson Eva, NP  VRAYLAR 1.5 MG capsule Take 1.5 mg by mouth daily.    [provider]    Current Facility-Administered Medications  Medication Dose Route Frequency Provider Last Rate Last Admin   acetaminophen (TYLENOL) tablet 650 mg  650 mg Oral Q6H PRN Howerter, Justin B, DO       Or   acetaminophen (TYLENOL) suppository 650 mg  650 mg Rectal Q6H PRN Howerter, Justin B, DO       cefTRIAXone (ROCEPHIN) 2 g in sodium chloride 0.9 % 100 mL IVPB  2 g Intravenous Daily Zigmund Daniel., MD       HYDROmorphone (DILAUDID) injection 0.5 mg  0.5 mg Intravenous Q2H PRN Howerter, Justin B, DO   0.5 mg at 09/07/22 6440   lactated ringers infusion   Intravenous Continuous Howerter, Justin B, DO 100 mL/hr at 09/07/22 0355 New Bag at 09/07/22 0355   metroNIDAZOLE (FLAGYL) IVPB 500 mg  500 mg Intravenous BID Zigmund Daniel., MD       naloxone [facility]) injection 0.4 mg  0.4 mg Intravenous PRN Howerter, Justin B, DO       ondansetron (ZOFRAN) injection 4 mg  4 mg Intravenous Q6H PRN Howerter, Justin B, DO       pantoprazole (PROTONIX) injection 40 mg  40 mg Intravenous Q24H Howerter, Justin B, DO   40 mg at 09/07/22 0349    Allergies as of 09/06/2022 - Review Complete 09/06/2022  Allergen Reaction Noted   Penicillins Nausea And Vomiting 01/25/2014    Review of Systems:    Constitutional: No weight loss, fever, chills, weakness or fatigue HEENT: Eyes: No change in vision               Ears, Nose, Throat:  No change in hearing or congestion Skin: No rash or itching Cardiovascular: No chest pain, chest pressure or palpitations   Respiratory: No SOB or cough Gastrointestinal: See HPI and otherwise negative Genitourinary: No dysuria or change in urinary frequency Neurological: No headache, dizziness or  syncope Musculoskeletal: No new muscle or joint pain Hematologic: No bleeding or bruising Psychiatric: No history of depression or anxiety     Physical Exam:  Vital signs in last 24 hours: Temp:  [98 F (36.7 C)-98.4 F (36.9 C)] 98.1 F (36.7 C) (07/30 0722) Pulse Rate:  [81-103] 90 (07/30 0722) Resp:  [17-22] 18 (07/30 0722) BP: (108-164)/(64-109) 144/81 (07/30 0722) SpO2:  [94 %-98 %] 97 % (07/30 0722) Weight:  [132.8 kg-142.2 kg] 132.8 kg (07/30 0300) Last BM Date : 09/04/22 Last BM recorded by nurses in past 5 days No  data recorded  General:   Pleasant, obese female in no acute distress Head:  Normocephalic and atraumatic. Eyes: sclerae anicteric,conjunctive pink  Heart:  regular rate and rhythm, no murmurs or gallops Pulm: Clear anteriorly; no wheezing Abdomen:  Soft, Obese AB, Sluggish bowel sounds. moderate tenderness in the RUQ. Without guarding and Without rebound, No organomegaly appreciated. Extremities:  Without edema. Msk:  Symmetrical without gross deformities. Peripheral pulses intact.  Neurologic:  Alert and  oriented x4;  No focal deficits.  Skin:   Dry and intact without significant lesions or rashes. Psychiatric:  Cooperative. Normal mood and affect.  LAB RESULTS: Recent Labs    09/06/22 1710 09/07/22 0108 09/07/22 0557  WBC 10.8* 11.0* 9.5  HGB 11.7* 11.0* 10.8*  HCT 38.4 35.9* 35.5*  PLT 347 295 260   BMET Recent Labs    09/06/22 1710 09/07/22 0557  NA 139 138  K 3.6 3.8  CL 102 102  CO2 29 26  GLUCOSE 104* 96  BUN 14 8  CREATININE 0.82 0.76  CALCIUM 8.6* 8.5*   LFT Recent Labs    09/06/22 1710 09/07/22 0557  PROT 6.5 6.0*  ALBUMIN 3.4* 2.8*  AST 17 15  ALT 27 26  ALKPHOS 70 69  BILITOT 0.2* 0.3  BILIDIR <0.1  --   IBILI NOT CALCULATED  --    PT/INR Recent Labs    09/07/22 0557  LABPROT 13.9  INR 1.1    STUDIES: US Abdomen Limited RUQ (LIVER/GB)  Result Date: 09/06/2022 CLINICAL DATA:  Upper abdominal pain EXAM:  ULTRASOUND ABDOMEN LIMITED RIGHT UPPER QUADRANT COMPARISON:  None Available. FINDINGS: Gallbladder: Multiple shadowing stones. Gallbladder sludge. Increased wall thickness up to 9.4 mm with trace pericholecystic fluid. Positive sonographic Murphy. Common bile duct: Diameter: 10.9 mm. At least 2 shadowing stones within the common bile duct, these measure 6 and 7 mm. Liver: Slightly echogenic liver parenchyma. No focal hepatic abnormality. Portal vein is patent on color Doppler imaging with normal direction of blood flow towards the liver. Other: None. IMPRESSION: 1. Cholelithiasis with gallbladder wall thickening and positive sonographic Murphy, findings consistent with acute cholecystitis. 2. Dilated common bile duct with at least 2 stones within the common bile duct consistent with choledocholithiasis. 3. Slightly echogenic liver parenchyma consistent with steatosis. Electronically Signed   By: Jasmine Pang M.D.   On: 09/06/2022 20:48   DG Chest 2 View  Result Date: 09/06/2022 CLINICAL DATA:  Chest pain. EXAM: CHEST - 2 VIEW COMPARISON:  December 11, 2015 FINDINGS: The heart size and mediastinal contours are within normal limits. Both lungs are clear. Multilevel degenerative changes are seen involving the mid and lower thoracic spine. IMPRESSION: No active cardiopulmonary disease. Electronically Signed   By: Aram Candela M.D.   On: 09/06/2022 19:05      Impression    Acute cholecystitis with concern for choledocholithiasis WBC 9.5 HGB 10.8 Platelets 260 AST 15 ALT 26  Alkphos 69 TBili 0.3 09/07/2022 INR 1.1  [place]: Acute cholecystitis + Murphy's, concern for 2 stones in the CBD  Normocytic anemia Some minor rectal bleeding, family history of colon cancer in her mother  Obesity  Fatty liver Seen on [place] Normal LFTS No ETOH use  Principal Problem:   Choledocholithiasis Active Problems:   Essential hypertension   Right upper quadrant abdominal pain   Nausea   Depression    Osteoarthritis   GERD (gastroesophageal reflux disease)    LOS: 0 days     Plan   Patient's US shows  CBD stones however she has completely normal AST, ALT, alkaline phosphatase and bilirubin,  Dr. Marina Goodell evaluated ultrasound and suggest getting MRCP to confirm MRCP ordered, patient has some claustrophobia we will order Ativan prior. -If MRCP is positive we will plan for ERCP with Dr. Marina Goodell. -I thoroughly discussed procedure with the patient to include nature, alternatives, benefits, and risks (including but not limited to post ERCP pancreatitis, bleeding, infection, perforation, anesthesia/cardiac pulmonary complications). Discussed risk of pancreatitis associated with ERCP. Patient verbalized understanding and gave verbal consent to proceed with ERCP. - Daily CBC, CMET -Continue supportive care -Continue pain control, IV antibiotics -Continue IV hydration, can do clear liquids today after MRCP, n.p.o. at midnight. -General surgery has seen the patient, planning cholecystectomy. -Will check iron, ferritin and B12 with anemia -Will need eventual screening colonoscopy for rectal bleeding -Advised to stop Goody powders. Continue protonix once daily -Can do outpatient workup for fatty liver, weight loss advised.  Thank you for your kind consultation, we will continue to follow.   Doree Albee  09/07/2022, 9:11 AM

## 2022-09-07 NOTE — Consult Note (Signed)
Reason for Consult:cholecystitis, choledocholithiasis Referring Physician: [name] is an 47 y.o. female.  HPI: 47yo F with PMHx asthma, HTN, and obesity C/O 3d increasing epigastric pain. Now also in RUQQ and through to her back. She took medication for gas without relief. She was seen at [facility]. W/U there included U/S showing gallstones, GB wall thickening, sonographic Murphy's sign, and dilated CBD with at least 2 stones. [facility] did not have ERCP capability so she was transferred to [facility]. Currently C/O HA and epigastric pain.  Past Medical History:  Diagnosis Date   Asthma    Chronic headache    Chronic knee pain    Hypertension     Past Surgical History:  Procedure Laterality Date   CESAREAN SECTION      Family History  Problem Relation Age of Onset   Hypertension Mother    Diabetes Mother    Hyperlipidemia Mother    Cancer Mother    Stroke Mother    COPD Father     Social History:  reports that she has been smoking cigarettes. She has never used smokeless tobacco. She reports that she does not drink alcohol and does not use drugs.  Allergies:  Allergies  Allergen Reactions   Penicillins Nausea And Vomiting    Medications: I have reviewed the patient's current medications.  Results for orders placed or performed during the hospital encounter of 09/07/22 (from the past 48 hour(s))  CBC with Differential/Platelet     Status: Abnormal   Collection Time: 09/07/22  5:57 AM  Result Value Ref Range   WBC 9.5 4.0 - 10.5 K/uL   RBC 4.38 3.87 - 5.11 MIL/uL   Hemoglobin 10.8 (L) 12.0 - 15.0 g/dL   HCT 62.6 (L) 94.8 - 54.6 %   MCV 81.1 80.0 - 100.0 fL   MCH 24.7 (L) 26.0 - 34.0 pg   MCHC 30.4 30.0 - 36.0 g/dL   RDW 27.0 35.0 - 09.3 %   Platelets 260 150 - 400 K/uL   nRBC 0.0 0.0 - 0.2 %   Neutrophils Relative % 66 %   Neutro Abs 6.3 1.7 - 7.7 K/uL   Lymphocytes Relative 22 %   Lymphs Abs 2.1 0.7 - 4.0 K/uL   Monocytes Relative 9 %    Monocytes Absolute 0.8 0.1 - 1.0 K/uL   Eosinophils Relative 3 %   Eosinophils Absolute 0.3 0.0 - 0.5 K/uL   Basophils Relative 0 %   Basophils Absolute 0.0 0.0 - 0.1 K/uL   Immature Granulocytes 0 %   Abs Immature Granulocytes 0.03 0.00 - 0.07 K/uL    Comment: Performed at [facility], [address], [place], [place] [address]  Comprehensive metabolic panel     Status: Abnormal   Collection Time: 09/07/22  5:57 AM  Result Value Ref Range   Sodium 138 135 - 145 mmol/L   Potassium 3.8 3.5 - 5.1 mmol/L   Chloride 102 98 - 111 mmol/L   CO2 26 22 - 32 mmol/L   Glucose, Bld 96 70 - 99 mg/dL    Comment: Glucose reference range applies only to samples taken after fasting for at least 8 hours.   BUN 8 6 - 20 mg/dL   Creatinine, Ser 9.37 0.44 - 1.00 mg/dL   Calcium 8.5 (L) 8.9 - 10.3 mg/dL   Total Protein 6.0 (L) 6.5 - 8.1 g/dL   Albumin 2.8 (L) 3.5 - 5.0 g/dL   AST 15 15 - 41  U/L   ALT 26 0 - 44 U/L   Alkaline Phosphatase 69 38 - 126 U/L   Total Bilirubin 0.3 0.3 - 1.2 mg/dL   GFR, Estimated >86 >57 mL/min    Comment: (NOTE) Calculated using the CKD-EPI Creatinine Equation (2021)    Anion gap 10 5 - 15    Comment: Performed at [facility], [address], [place], [place] [address]  Magnesium     Status: None   Collection Time: 09/07/22  5:57 AM  Result Value Ref Range   Magnesium 1.8 1.7 - 2.4 mg/dL    Comment: Performed at [facility], [address] [place], [place] [address]  Protime-INR     Status: None   Collection Time: 09/07/22  5:57 AM  Result Value Ref Range   Prothrombin Time 13.9 11.4 - 15.2 seconds   INR 1.1 0.8 - 1.2    Comment: (NOTE) INR goal varies based on device and disease states. Performed at [facility], [address] [place], [place] [address]     US Abdomen Limited RUQ (LIVER/GB)  Result Date: 09/06/2022 CLINICAL DATA:  Upper abdominal pain EXAM: ULTRASOUND ABDOMEN LIMITED RIGHT UPPER QUADRANT COMPARISON:  None  Available. FINDINGS: Gallbladder: Multiple shadowing stones. Gallbladder sludge. Increased wall thickness up to 9.4 mm with trace pericholecystic fluid. Positive sonographic Murphy. Common bile duct: Diameter: 10.9 mm. At least 2 shadowing stones within the common bile duct, these measure 6 and 7 mm. Liver: Slightly echogenic liver parenchyma. No focal hepatic abnormality. Portal vein is patent on color Doppler imaging with normal direction of blood flow towards the liver. Other: None. IMPRESSION: 1. Cholelithiasis with gallbladder wall thickening and positive sonographic Murphy, findings consistent with acute cholecystitis. 2. Dilated common bile duct with at least 2 stones within the common bile duct consistent with choledocholithiasis. 3. Slightly echogenic liver parenchyma consistent with steatosis. Electronically Signed   By: Jasmine Pang M.D.   On: 09/06/2022 20:48   DG Chest 2 View  Result Date: 09/06/2022 CLINICAL DATA:  Chest pain. EXAM: CHEST - 2 VIEW COMPARISON:  December 11, 2015 FINDINGS: The heart size and mediastinal contours are within normal limits. Both lungs are clear. Multilevel degenerative changes are seen involving the mid and lower thoracic spine. IMPRESSION: No active cardiopulmonary disease. Electronically Signed   By: Aram Candela M.D.   On: 09/06/2022 19:05    Review of Systems  Constitutional:  Positive for appetite change.  HENT: Negative.    Eyes: Negative.   Respiratory:  Negative for shortness of breath.   Cardiovascular:  Negative for chest pain.  Gastrointestinal:  Positive for abdominal pain and nausea.  Endocrine: Negative.   Genitourinary: Negative.   Musculoskeletal:  Positive for back pain.  Allergic/Immunologic: Negative.   Neurological:  Positive for headaches.  Hematological: Negative.   Psychiatric/Behavioral: Negative.     Weight 132.8 kg. Physical Exam Constitutional:      General: She is not in acute distress.    Appearance: She is obese.   HENT:     Mouth/Throat:     Mouth: Mucous membranes are moist.  Eyes:     General: No scleral icterus. Cardiovascular:     Rate and Rhythm: Normal rate and regular rhythm.     Pulses: Normal pulses.     Comments: Some peripheral edema LE Pulmonary:     Effort: Pulmonary effort is normal.     Breath sounds: Normal breath sounds.  Abdominal:  General: Abdomen is flat.     Palpations: Abdomen is soft.     Tenderness: There is abdominal tenderness. There is no guarding or rebound.     Comments: Epigastric and RUQ tenderness  Skin:    General: Skin is warm.  Neurological:     Mental Status: She is alert and oriented to person, place, and time.  Psychiatric:        Mood and Affect: Mood normal.     Assessment/Plan: Cholecystitis - agree with Rocephin, IVF, bowel rest Choledocholithiasis - transferred here for GI evaluation and possible ERCP  We will follow and plan laparoscopic cholecystectomy this admission. I spoke with her about the plan and answered her questions.  Liz Malady 09/07/2022, 8:37 AM

## 2022-09-07 NOTE — Progress Notes (Signed)
PROGRESS NOTE    Deanna Vazquez  ZOX:[id number] DOB: October 09, 1975 DOA: 09/07/2022 PCP: Carlean Jews, NP  No chief complaint on file.   Brief Narrative:   Deanna Vazquez is Deanna Vazquez 47 y.o. female with medical history significant for essential pretension, depression, GERD, who is admitted to [facility] on 09/07/2022 by way of transfer from [facility] ED with choledocholithiasis after presenting from home to [facility] ED complaining of abdominal pain.  Found to have acute cholecystitis and choledocholithiasis.   Assessment & Plan:   Principal Problem:   Choledocholithiasis Active Problems:   Essential hypertension   Right upper quadrant abdominal pain   Nausea   Depression   Osteoarthritis   GERD (gastroesophageal reflux disease)  Acute Cholecystitis  Cholelithiasis  Choledocholithiasis  RUQ [place] with findings c/w cholecystitis as well as choledocholithiasis Ceftriaxone, flagyl  NPO  Didn't tolerate MRCP GI recommended cholecystectomy with IOC Appreciate surgery recs  Hypertension Holding benicar  Depression Continue effexor, vraylar   Osteoarthritis  Holding voltaren Takes gabapentin only as needed  Migraine Headaches Headache cocktail Has ubrelvy which she takes at home prn  GERD PPI    DVT prophylaxis: SCD Code Status: full Family Communication: none Disposition:   Status is: Inpatient Remains inpatient appropriate because: need for inpatient care   Consultants:  Surgery GI  Procedures:  none  Antimicrobials:  Anti-infectives (From admission, onward)    Start     Dose/Rate Route Frequency Ordered Stop   09/07/22 0800  cefTRIAXone (ROCEPHIN) 2 g in sodium chloride 0.9 % 100 mL IVPB        2 g 200 mL/hr over 30 Minutes Intravenous Daily 09/07/22 0735     09/07/22 0800  metroNIDAZOLE (FLAGYL) IVPB 500 mg        500 mg 100 mL/hr over 60 Minutes Intravenous 2 times daily 09/07/22 0735         Subjective: No new  complaints  Objective: Vitals:   09/07/22 0300  Weight: 132.8 kg    Intake/Output Summary (Last 24 hours) at 09/07/2022 1449 Last data filed at 09/07/2022 8119 Gross per 24 hour  Intake 289.92 ml  Output --  Net 289.92 ml   Filed Weights   09/07/22 0300  Weight: 132.8 kg    Examination:  General exam: Appears calm and comfortable  Respiratory system: unlabored Cardiovascular system: RRR Gastrointestinal system: RUQ TTP  Central nervous system: Alert and oriented. No focal neurological deficits. Extremities: no LEE    Data Reviewed: I have personally reviewed following labs and imaging studies  CBC: Recent Labs  Lab 09/06/22 1710 09/07/22 0108 09/07/22 0557  WBC 10.8* 11.0* 9.5  NEUTROABS  --  8.5* 6.3  HGB 11.7* 11.0* 10.8*  HCT 38.4 35.9* 35.5*  MCV 82.2 82.0 81.1  PLT 347 295 260    Basic Metabolic Panel: Recent Labs  Lab 09/06/22 1710 09/07/22 0557  NA 139 138  K 3.6 3.8  CL 102 102  CO2 29 26  GLUCOSE 104* 96  BUN 14 8  CREATININE 0.82 0.76  CALCIUM 8.6* 8.5*  MG  --  1.8    GFR: Estimated Creatinine Clearance: 108 mL/min (by C-G formula based on SCr of 0.76 mg/dL).  Liver Function Tests: Recent Labs  Lab 09/06/22 1710 09/07/22 0557  AST 17 15  ALT 27 26  ALKPHOS 70 69  BILITOT 0.2* 0.3  PROT 6.5 6.0*  ALBUMIN 3.4* 2.8*    CBG: No results for input(s): "GLUCAP" in the last 168 hours.  No results found for this or any previous visit (from the past 240 hour(s)).       Radiology Studies: US Abdomen Limited RUQ (LIVER/GB)  Result Date: 09/06/2022 CLINICAL DATA:  Upper abdominal pain EXAM: ULTRASOUND ABDOMEN LIMITED RIGHT UPPER QUADRANT COMPARISON:  None Available. FINDINGS: Gallbladder: Multiple shadowing stones. Gallbladder sludge. Increased wall thickness up to 9.4 mm with trace pericholecystic fluid. Positive sonographic Murphy. Common bile duct: Diameter: 10.9 mm. At least 2 shadowing stones within the common bile duct,  these measure 6 and 7 mm. Liver: Slightly echogenic liver parenchyma. No focal hepatic abnormality. Portal vein is patent on color Doppler imaging with normal direction of blood flow towards the liver. Other: None. IMPRESSION: 1. Cholelithiasis with gallbladder wall thickening and positive sonographic Murphy, findings consistent with acute cholecystitis. 2. Dilated common bile duct with at least 2 stones within the common bile duct consistent with choledocholithiasis. 3. Slightly echogenic liver parenchyma consistent with steatosis. Electronically Signed   By: Jasmine Pang M.D.   On: 09/06/2022 20:48   DG Chest 2 View  Result Date: 09/06/2022 CLINICAL DATA:  Chest pain. EXAM: CHEST - 2 VIEW COMPARISON:  December 11, 2015 FINDINGS: The heart size and mediastinal contours are within normal limits. Both lungs are clear. Multilevel degenerative changes are seen involving the mid and lower thoracic spine. IMPRESSION: No active cardiopulmonary disease. Electronically Signed   By: Aram Candela M.D.   On: 09/06/2022 19:05        Scheduled Meds:  cariprazine  1.5 mg Oral Daily   diphenhydrAMINE  12.5 mg Intravenous Once   ketorolac  15 mg Intravenous Once   pantoprazole (PROTONIX) IV  40 mg Intravenous Q24H   prochlorperazine  10 mg Intravenous Once   sucralfate  1 g Oral QID   traZODone  100 mg Oral QHS   [START ON 09/08/2022] venlafaxine XR  225 mg Oral Q breakfast   Continuous Infusions:  cefTRIAXone (ROCEPHIN)  IV 2 g (09/07/22 0900)   lactated ringers 100 mL/hr at 09/07/22 0355   metronidazole 500 mg (09/07/22 0844)   sodium chloride       LOS: 0 days    Time spent: over 30 min    Lacretia Nicks, MD [facility]   To contact the attending provider between 7A-7P or the covering provider during after hours 7P-7A, please log into the web site [email or web address] and access using universal [place] password for that web site. If you do not have the password, please call the  hospital operator.  09/07/2022, 2:49 PM

## 2022-09-08 ENCOUNTER — Inpatient Hospital Stay (HOSPITAL_COMMUNITY): Payer: Commercial Managed Care - PPO

## 2022-09-08 ENCOUNTER — Other Ambulatory Visit: Payer: Self-pay

## 2022-09-08 ENCOUNTER — Inpatient Hospital Stay (HOSPITAL_COMMUNITY): Payer: Commercial Managed Care - PPO | Admitting: Anesthesiology

## 2022-09-08 ENCOUNTER — Encounter (HOSPITAL_COMMUNITY): Payer: Self-pay | Admitting: Internal Medicine

## 2022-09-08 ENCOUNTER — Encounter (HOSPITAL_COMMUNITY)
Admission: RE | Disposition: A | Discharge status: Home / Self Care | Payer: Self-pay | Source: Home / Self Care | Attending: Internal Medicine

## 2022-09-08 DIAGNOSIS — K8040 Calculus of bile duct with cholecystitis, unspecified, without obstruction: Secondary | ICD-10-CM | POA: Diagnosis not present

## 2022-09-08 DIAGNOSIS — K819 Cholecystitis, unspecified: Secondary | ICD-10-CM | POA: Diagnosis not present

## 2022-09-08 DIAGNOSIS — K8050 Calculus of bile duct without cholangitis or cholecystitis without obstruction: Secondary | ICD-10-CM | POA: Diagnosis not present

## 2022-09-08 DIAGNOSIS — R1011 Right upper quadrant pain: Secondary | ICD-10-CM | POA: Diagnosis not present

## 2022-09-08 HISTORY — PX: CHOLECYSTECTOMY: SHX55

## 2022-09-08 LAB — COMPREHENSIVE METABOLIC PANEL WITH GFR
ALT: 21 U/L (ref 0–44)
AST: 14 U/L — ABNORMAL LOW (ref 15–41)
Albumin: 2.5 g/dL — ABNORMAL LOW (ref 3.5–5.0)
Alkaline Phosphatase: 63 U/L (ref 38–126)
Anion gap: 12 (ref 5–15)
BUN: 9 mg/dL (ref 6–20)
CO2: 24 mmol/L (ref 22–32)
Calcium: 8.3 mg/dL — ABNORMAL LOW (ref 8.9–10.3)
Chloride: 102 mmol/L (ref 98–111)
Creatinine, Ser: 0.79 mg/dL (ref 0.44–1.00)
GFR, Estimated: >60 mL/min
Glucose, Bld: 106 mg/dL — ABNORMAL HIGH (ref 70–99)
Potassium: 3.7 mmol/L (ref 3.5–5.1)
Sodium: 138 mmol/L (ref 135–145)
Total Bilirubin: 0.2 mg/dL — ABNORMAL LOW (ref 0.3–1.2)
Total Protein: 5.5 g/dL — ABNORMAL LOW (ref 6.5–8.1)

## 2022-09-08 SURGERY — LAPAROSCOPIC CHOLECYSTECTOMY WITH INTRAOPERATIVE CHOLANGIOGRAM
Anesthesia: General | Site: Abdomen

## 2022-09-08 MED ORDER — FENTANYL CITRATE (PF) 100 MCG/2ML IJ SOLN
INTRAMUSCULAR | Status: AC
  Administered 2022-09-08: 25 ug via INTRAVENOUS
  Filled 2022-09-08: qty 2

## 2022-09-08 MED ORDER — LIDOCAINE 2% (20 MG/ML) 5 ML SYRINGE
INTRAMUSCULAR | Status: DC | PRN
  Administered 2022-09-08: 100 mg via INTRAVENOUS

## 2022-09-08 MED ORDER — ORAL CARE MOUTH RINSE: 15.0000 mL | Freq: Once | OROMUCOSAL | Status: AC

## 2022-09-08 MED ORDER — MIDAZOLAM HCL 2 MG/2ML IJ SOLN
INTRAMUSCULAR | Status: DC | PRN
  Administered 2022-09-08: 2 mg via INTRAVENOUS

## 2022-09-08 MED ORDER — CHLORHEXIDINE GLUCONATE 0.12 % MT SOLN
OROMUCOSAL | Status: AC
  Administered 2022-09-08: 15 mL via OROMUCOSAL
  Filled 2022-09-08: qty 15

## 2022-09-08 MED ORDER — HYDRALAZINE HCL 20 MG/ML IJ SOLN
10.0000 mg | Freq: Once | INTRAMUSCULAR | Status: AC
  Administered 2022-09-08: 10 mg via INTRAVENOUS

## 2022-09-08 MED ORDER — BUPIVACAINE-EPINEPHRINE 0.25% -1:200000 IJ SOLN
INTRAMUSCULAR | Status: DC | PRN
  Administered 2022-09-08: 24 mL

## 2022-09-08 MED ORDER — ONDANSETRON HCL 4 MG/2ML IJ SOLN
4.0000 mg | Freq: Four times a day (QID) | INTRAMUSCULAR | Status: DC | PRN
  Administered 2022-09-08 – 2022-09-09 (×3): 4 mg via INTRAVENOUS
  Filled 2022-09-08 (×3): qty 2

## 2022-09-08 MED ORDER — DEXAMETHASONE SODIUM PHOSPHATE 10 MG/ML IJ SOLN
INTRAMUSCULAR | Status: DC | PRN
  Administered 2022-09-08: 10 mg via INTRAVENOUS

## 2022-09-08 MED ORDER — FENTANYL CITRATE (PF) 250 MCG/5ML IJ SOLN
INTRAMUSCULAR | Status: DC | PRN
  Administered 2022-09-08: 50 ug via INTRAVENOUS
  Administered 2022-09-08: 100 ug via INTRAVENOUS
  Administered 2022-09-08: 50 ug via INTRAVENOUS

## 2022-09-08 MED ORDER — SUGAMMADEX SODIUM 200 MG/2ML IV SOLN
INTRAVENOUS | Status: DC | PRN
  Administered 2022-09-08: 400 mg via INTRAVENOUS

## 2022-09-08 MED ORDER — SODIUM CHLORIDE 0.9 % IR SOLN
Status: DC | PRN
  Administered 2022-09-08: 1000 mL
  Administered 2022-09-08: 2000 mL

## 2022-09-08 MED ORDER — POTASSIUM CHLORIDE 10 MEQ/100ML IV SOLN
10.0000 meq | INTRAVENOUS | Status: AC
  Administered 2022-09-08 (×2): 10 meq via INTRAVENOUS
  Filled 2022-09-08 (×2): qty 100

## 2022-09-08 MED ORDER — PHENYLEPHRINE HCL-NACL 20-0.9 MG/250ML-% IV SOLN
INTRAVENOUS | Status: DC | PRN
Start: 2022-09-08 — End: 2022-09-08
  Administered 2022-09-08: 20 ug/min via INTRAVENOUS

## 2022-09-08 MED ORDER — CHLORHEXIDINE GLUCONATE 0.12 % MT SOLN: 15.0000 mL | Freq: Once | OROMUCOSAL | Status: AC

## 2022-09-08 MED ORDER — FENTANYL CITRATE (PF) 100 MCG/2ML IJ SOLN
25.0000 ug | INTRAMUSCULAR | Status: DC | PRN
  Administered 2022-09-08: 25 ug via INTRAVENOUS
  Administered 2022-09-08: 50 ug via INTRAVENOUS

## 2022-09-08 MED ORDER — BUPIVACAINE-EPINEPHRINE (PF) 0.25% -1:200000 IJ SOLN
INTRAMUSCULAR | Status: AC
  Filled 2022-09-08: qty 30

## 2022-09-08 MED ORDER — OXYCODONE HCL 5 MG PO TABS: 5.0000 mg | ORAL_TABLET | Freq: Once | ORAL | Status: DC | PRN

## 2022-09-08 MED ORDER — SENNOSIDES-DOCUSATE SODIUM 8.6-50 MG PO TABS
1.0000 | ORAL_TABLET | Freq: Every evening | ORAL | Status: DC | PRN
  Administered 2022-09-09: 1 via ORAL
  Filled 2022-09-08: qty 1

## 2022-09-08 MED ORDER — HYDROMORPHONE HCL 1 MG/ML IJ SOLN
0.5000 mg | INTRAMUSCULAR | Status: DC | PRN
  Administered 2022-09-08 – 2022-09-11 (×14): 1 mg via INTRAVENOUS
  Filled 2022-09-08 (×14): qty 1

## 2022-09-08 MED ORDER — METOPROLOL TARTRATE 5 MG/5ML IV SOLN: 5.0000 mg | INTRAVENOUS | Status: DC | PRN

## 2022-09-08 MED ORDER — PROPOFOL 500 MG/50ML IV EMUL
INTRAVENOUS | Status: DC | PRN
Start: 2022-09-08 — End: 2022-09-08
  Administered 2022-09-08: 50 ug/kg/min via INTRAVENOUS

## 2022-09-08 MED ORDER — 0.9 % SODIUM CHLORIDE (POUR BTL) OPTIME
TOPICAL | Status: DC | PRN
  Administered 2022-09-08: 1000 mL

## 2022-09-08 MED ORDER — HYDRALAZINE HCL 20 MG/ML IJ SOLN: 10.0000 mg | INTRAMUSCULAR | Status: DC | PRN

## 2022-09-08 MED ORDER — TRAZODONE HCL 50 MG PO TABS: 50.0000 mg | ORAL_TABLET | Freq: Every evening | ORAL | Status: DC | PRN

## 2022-09-08 MED ORDER — PROPOFOL 10 MG/ML IV BOLUS
INTRAVENOUS | Status: DC | PRN
  Administered 2022-09-08: 200 mg via INTRAVENOUS

## 2022-09-08 MED ORDER — ROCURONIUM BROMIDE 10 MG/ML (PF) SYRINGE
PREFILLED_SYRINGE | INTRAVENOUS | Status: DC | PRN
  Administered 2022-09-08: 100 mg via INTRAVENOUS
  Administered 2022-09-08: 20 mg via INTRAVENOUS

## 2022-09-08 MED ORDER — OXYCODONE HCL 5 MG/5ML PO SOLN: 5.0000 mg | Freq: Once | ORAL | Status: DC | PRN

## 2022-09-08 MED ORDER — IPRATROPIUM-ALBUTEROL 0.5-2.5 (3) MG/3ML IN SOLN: 3.0000 mL | RESPIRATORY_TRACT | Status: DC | PRN

## 2022-09-08 MED ORDER — LACTATED RINGERS IV SOLN: INTRAVENOUS | Status: DC

## 2022-09-08 MED ORDER — ONDANSETRON HCL 4 MG/2ML IJ SOLN
INTRAMUSCULAR | Status: DC | PRN
  Administered 2022-09-08: 4 mg via INTRAVENOUS

## 2022-09-08 MED ORDER — HYDRALAZINE HCL 20 MG/ML IJ SOLN
INTRAMUSCULAR | Status: AC
  Administered 2022-09-08: 10 mg via INTRAVENOUS
  Filled 2022-09-08: qty 1

## 2022-09-08 MED ORDER — ONDANSETRON HCL 4 MG/2ML IJ SOLN: 4.0000 mg | Freq: Four times a day (QID) | INTRAMUSCULAR | Status: DC | PRN

## 2022-09-08 MED ORDER — SODIUM CHLORIDE 0.9 % IV SOLN
250.0000 mg | Freq: Every day | INTRAVENOUS | Status: AC
  Administered 2022-09-09: 250 mg via INTRAVENOUS
  Filled 2022-09-08 (×2): qty 20

## 2022-09-08 MED ORDER — FENTANYL CITRATE (PF) 250 MCG/5ML IJ SOLN
INTRAMUSCULAR | Status: AC
  Filled 2022-09-08: qty 5

## 2022-09-08 MED ORDER — HYDRALAZINE HCL 20 MG/ML IJ SOLN: 10.0000 mg | Freq: Once | INTRAMUSCULAR | Status: AC

## 2022-09-08 MED ORDER — MIDAZOLAM HCL 2 MG/2ML IJ SOLN
INTRAMUSCULAR | Status: AC
  Filled 2022-09-08: qty 2

## 2022-09-08 MED ORDER — GUAIFENESIN 100 MG/5ML PO LIQD: 5.0000 mL | ORAL | Status: DC | PRN

## 2022-09-08 SURGICAL SUPPLY — 57 items
ADH SKN CLS APL DERMABOND .7 (GAUZE/BANDAGES/DRESSINGS) ×1
APL PRP STRL LF DISP 70% ISPRP (MISCELLANEOUS) ×1
APPLIER CLIP 5 13 M/L LIGAMAX5 (MISCELLANEOUS) ×1
APPLIER CLIP ROT 10 11.4 M/L (STAPLE) ×1
APR CLP MED LRG 11.4X10 (STAPLE) ×1
APR CLP MED LRG 5 ANG JAW (MISCELLANEOUS) ×1
BAG SPEC RTRVL 10 TROC 200 (ENDOMECHANICALS) ×1
BIOPATCH RED 1 DISK 7.0 (GAUZE/BANDAGES/DRESSINGS) IMPLANT
CANISTER SUCT 3000ML PPV (MISCELLANEOUS) ×1 IMPLANT
CHLORAPREP W/TINT 26 (MISCELLANEOUS) ×1 IMPLANT
CLIP APPLIE 5 13 M/L LIGAMAX5 (MISCELLANEOUS) ×1 IMPLANT
CLIP APPLIE ROT 10 11.4 M/L (STAPLE) IMPLANT
COVER MAYO STAND STRL (DRAPES) ×1 IMPLANT
COVER SURGICAL LIGHT HANDLE (MISCELLANEOUS) ×1 IMPLANT
DERMABOND ADVANCED .7 DNX12 (GAUZE/BANDAGES/DRESSINGS) ×1 IMPLANT
DRAIN CHANNEL 19F RND (DRAIN) IMPLANT
DRAPE C-ARM 42X120 X-RAY (DRAPES) ×1 IMPLANT
DRSG TEGADERM 4X4.75 (GAUZE/BANDAGES/DRESSINGS) IMPLANT
ELECT REM PT RETURN 9FT ADLT (ELECTROSURGICAL) ×1
ELECTRODE REM PT RTRN 9FT ADLT (ELECTROSURGICAL) ×1 IMPLANT
ENDOLOOP SUT PDS II 0 18 (SUTURE) IMPLANT
EVACUATOR SILICONE 100CC (DRAIN) IMPLANT
GLOVE BIO SURGEON STRL SZ8 (GLOVE) ×1 IMPLANT
GLOVE BIOGEL PI IND STRL 8 (GLOVE) ×1 IMPLANT
GLOVE SURG SS PI 6.5 STRL IVOR (GLOVE) IMPLANT
GOWN STRL REUS W/ TWL LRG LVL3 (GOWN DISPOSABLE) ×2 IMPLANT
GOWN STRL REUS W/ TWL XL LVL3 (GOWN DISPOSABLE) ×1 IMPLANT
GOWN STRL REUS W/TWL LRG LVL3 (GOWN DISPOSABLE) ×3
GOWN STRL REUS W/TWL XL LVL3 (GOWN DISPOSABLE) ×1
IRRIG SUCT STRYKERFLOW 2 WTIP (MISCELLANEOUS) ×1
IRRIGATION SUCT STRKRFLW 2 WTP (MISCELLANEOUS) ×1 IMPLANT
KIT BASIN OR (CUSTOM PROCEDURE TRAY) ×1 IMPLANT
KIT TURNOVER KIT B (KITS) ×1 IMPLANT
L-HOOK LAP DISP 36CM (ELECTROSURGICAL) ×1
LHOOK LAP DISP 36CM (ELECTROSURGICAL) ×1 IMPLANT
NDL 22X1.5 STRL (OR ONLY) (MISCELLANEOUS) ×1 IMPLANT
NEEDLE 22X1.5 STRL (OR ONLY) (MISCELLANEOUS) ×1 IMPLANT
NS IRRIG 1000ML POUR BTL (IV SOLUTION) ×1 IMPLANT
PAD ARMBOARD 7.5X6 YLW CONV (MISCELLANEOUS) ×1 IMPLANT
PENCIL BUTTON HOLSTER BLD 10FT (ELECTRODE) ×1 IMPLANT
POUCH RETRIEVAL ECOSAC 10 (ENDOMECHANICALS) ×1 IMPLANT
SCISSORS LAP 5X35 DISP (ENDOMECHANICALS) ×1 IMPLANT
SET CHOLANGIOGRAPH 5 50 .035 (SET/KITS/TRAYS/PACK) ×1 IMPLANT
SET TUBE SMOKE EVAC HIGH FLOW (TUBING) ×1 IMPLANT
SLEEVE Z-THREAD 5X100MM (TROCAR) ×2 IMPLANT
SPECIMEN JAR SMALL (MISCELLANEOUS) ×1 IMPLANT
SUT ETHILON 2 0 FS 18 (SUTURE) IMPLANT
SUT VIC AB 4-0 PS2 27 (SUTURE) ×1 IMPLANT
SYS BAG RETRIEVAL 10MM (BASKET) ×1
SYSTEM BAG RETRIEVAL 10MM (BASKET) IMPLANT
TOWEL GREEN STERILE (TOWEL DISPOSABLE) ×1 IMPLANT
TRAY LAPAROSCOPIC MC (CUSTOM PROCEDURE TRAY) ×1 IMPLANT
TROCAR 11X100 Z THREAD (TROCAR) IMPLANT
TROCAR BALLN 12MMX100 BLUNT (TROCAR) ×1 IMPLANT
TROCAR Z-THREAD OPTICAL 5X100M (TROCAR) ×1 IMPLANT
WARMER LAPAROSCOPE (MISCELLANEOUS) ×1 IMPLANT
WATER STERILE IRR 1000ML POUR (IV SOLUTION) ×1 IMPLANT

## 2022-09-08 NOTE — Anesthesia Procedure Notes (Signed)
Procedure Name: Intubation Date/Time: 09/08/2022 1:50 PM  Performed by: Darryl Nestle, CRNAPre-anesthesia Checklist: Patient identified, Emergency Drugs available, Suction available and Patient being monitored Patient Re-evaluated:Patient Re-evaluated prior to induction Oxygen Delivery Method: Circle system utilized Preoxygenation: Pre-oxygenation with 100% oxygen Induction Type: IV induction Ventilation: Mask ventilation without difficulty Laryngoscope Size: Mac and 3 Grade View: Grade II Tube type: Oral Tube size: 7.0 mm Number of attempts: 1 Airway Equipment and Method: Stylet and Oral airway Placement Confirmation: ETT inserted through vocal cords under direct vision, positive ETCO2 and breath sounds checked- equal and bilateral Secured at: 22 cm Tube secured with: Tape Dental Injury: Teeth and Oropharynx as per pre-operative assessment

## 2022-09-08 NOTE — Progress Notes (Signed)
Gastroenterology Inpatient Follow Up    Subjective: Still having abdominal pain today.  Plan is for cholecystectomy with IOC today.  Objective: Vital signs in last 24 hours: Temp:  [98.3 F (36.8 C)-99.1 F (37.3 C)] 98.3 F (36.8 C) (07/31 0450) Pulse Rate:  [79-103] 82 (07/31 1151) Resp:  [18-20] 19 (07/31 1151) BP: (127-144)/(81-95) 134/81 (07/31 1151) SpO2:  [94 %-100 %] 100 % (07/31 1151) Weight:  [135.6 kg] 135.6 kg (07/31 0448) Last BM Date : 09/04/22  Intake/Output from previous day: 07/30 0701 - 07/31 0700 In: 769.6 [I.V.:420.7; IV Piggyback:348.9] Out: -  Intake/Output this shift: No intake/output data recorded.  General appearance: alert and cooperative Resp: No increased work of breathing Cardio: Regular rate GI: Tender to palpation in the right upper quadrant  Lab Results: Recent Labs    09/07/22 0108 09/07/22 0557 09/08/22 0611  WBC 11.0* 9.5 9.6  HGB 11.0* 10.8* 10.7*  HCT 35.9* 35.5* 35.3*  PLT 295 260 301   BMET Recent Labs    09/07/22 0557 09/08/22 0611 09/08/22 0810  NA 138 137 138  K 3.8 3.4* 3.7  CL 102 100 102  CO2 26 28 24   GLUCOSE 96 111* 106*  BUN 8 9 9   CREATININE 0.76 0.84 0.79  CALCIUM 8.5* 8.3* 8.3*   LFT Recent Labs    09/06/22 1710 09/07/22 0557 09/08/22 0810  PROT 6.5   < > 5.5*  ALBUMIN 3.4*   < > 2.5*  AST 17   < > 14*  ALT 27   < > 21  ALKPHOS 70   < > 63  BILITOT 0.2*   < > 0.2*  BILIDIR <0.1  --   --   IBILI NOT CALCULATED  --   --    < > = values in this interval not displayed.   PT/INR Recent Labs    09/07/22 0557  LABPROT 13.9  INR 1.1   Hepatitis Panel No results for input(s): "HEPBSAG", "HCVAB", "HEPAIGM", "HEPBIGM" in the last 72 hours. C-Diff No results for input(s): "CDIFFTOX" in the last 72 hours.  Studies/Results: US Abdomen Limited RUQ (LIVER/GB)  Result Date: 09/06/2022 CLINICAL DATA:  Upper abdominal pain EXAM: ULTRASOUND ABDOMEN LIMITED RIGHT UPPER QUADRANT COMPARISON:   None Available. FINDINGS: Gallbladder: Multiple shadowing stones. Gallbladder sludge. Increased wall thickness up to 9.4 mm with trace pericholecystic fluid. Positive sonographic Murphy. Common bile duct: Diameter: 10.9 mm. At least 2 shadowing stones within the common bile duct, these measure 6 and 7 mm. Liver: Slightly echogenic liver parenchyma. No focal hepatic abnormality. Portal vein is patent on color Doppler imaging with normal direction of blood flow towards the liver. Other: None. IMPRESSION: 1. Cholelithiasis with gallbladder wall thickening and positive sonographic Murphy, findings consistent with acute cholecystitis. 2. Dilated common bile duct with at least 2 stones within the common bile duct consistent with choledocholithiasis. 3. Slightly echogenic liver parenchyma consistent with steatosis. Electronically Signed   By: Jasmine Pang M.D.   On: 09/06/2022 20:48   DG Chest 2 View  Result Date: 09/06/2022 CLINICAL DATA:  Chest pain. EXAM: CHEST - 2 VIEW COMPARISON:  December 11, 2015 FINDINGS: The heart size and mediastinal contours are within normal limits. Both lungs are clear. Multilevel degenerative changes are seen involving the mid and lower thoracic spine. IMPRESSION: No active cardiopulmonary disease. Electronically Signed   By: Aram Candela M.D.   On: 09/06/2022 19:05    Medications: I have reviewed the patient's current medications. Scheduled:  [facility]  Hold] cariprazine  1.5 mg Oral Daily   [MAR Hold] pantoprazole (PROTONIX) IV  40 mg Intravenous Q24H   [MAR Hold] sucralfate  1 g Oral QID   [MAR Hold] traZODone  100 mg Oral QHS   [MAR Hold] venlafaxine XR  225 mg Oral Q breakfast   Continuous:  [MAR Hold] cefTRIAXone (ROCEPHIN)  IV 2 g (09/08/22 1101)   dextrose 5% lactated ringers 125 mL/hr at 09/07/22 2048   [MAR Hold] ferric gluconate (FERRLECIT) IVPB     lactated ringers 10 mL/hr at 09/08/22 1340   [MAR Hold] metronidazole 500 mg (09/08/22 0856)   PRN:0.9 %  irrigation (POUR BTL), [MAR Hold] acetaminophen **OR** [MAR Hold] acetaminophen, bupivacaine-EPINEPHrine, [MAR Hold] guaiFENesin, [MAR Hold] hydrALAZINE, [MAR Hold]  HYDROmorphone (DILAUDID) injection, [MAR Hold] ipratropium-albuterol, [MAR Hold] metoprolol tartrate, [MAR Hold] naLOXone (NARCAN)  injection, [MAR Hold] ondansetron (ZOFRAN) IV, [MAR Hold] senna-docusate, sodium chloride irrigation, [MAR Hold] traZODone  Assessment/Plan: 47 year old female with history of asthma and obesity presented with RUQ ab pain. LFTs are normal. RUQ U/S shows cholelithiasis with gallbladder wall thickening consistent with acute cholecystitis and dilated CBD with at least 2 stones within the CBD.  Surgery is planning for cholecystectomy with IOC today.  We will follow-up with the results of her IOC.  Lipase from yesterday was normal.  LFTs remain normal today.   LOS: 1 day   Deanna Vazquez 09/08/2022, 2:16 PM

## 2022-09-08 NOTE — Progress Notes (Signed)
Patient ID: Deanna Vazquez, female   DOB: 07-17-1975, 47 y.o.   MRN: [id number]      Subjective: Still some epigastric pain, a bit less ROS negative except as listed above. Objective: Vital signs in last 24 hours: Temp:  [98.3 F (36.8 C)-99.1 F (37.3 C)] 98.3 F (36.8 C) (07/31 0450) Pulse Rate:  [79-103] 91 (07/31 0812) Resp:  [18-20] 20 (07/31 0812) BP: (127-144)/(83-95) 127/83 (07/31 0812) SpO2:  [94 %-99 %] 94 % (07/31 0812) Weight:  [135.6 kg] 135.6 kg (07/31 0448) Last BM Date : 09/04/22  Intake/Output from previous day: 07/30 0701 - 07/31 0700 In: 769.6 [I.V.:420.7; IV Piggyback:348.9] Out: -  Intake/Output this shift: No intake/output data recorded.  General appearance: alert and cooperative Resp: clear to auscultation bilaterally GI: soft, tender epig and RUQ Extremities: mild edema  Lab Results: CBC  Recent Labs    09/07/22 0557 09/08/22 0611  WBC 9.5 9.6  HGB 10.8* 10.7*  HCT 35.5* 35.3*  PLT 260 301   BMET Recent Labs    09/07/22 0557 09/08/22 0611  NA 138 137  K 3.8 3.4*  CL 102 100  CO2 26 28  GLUCOSE 96 111*  BUN 8 9  CREATININE 0.76 0.84  CALCIUM 8.5* 8.3*   PT/INR Recent Labs    09/07/22 0557  LABPROT 13.9  INR 1.1   ABG No results for input(s): "PHART", "HCO3" in the last 72 hours.  Invalid input(s): "PCO2", "PO2"  Studies/Results: US Abdomen Limited RUQ (LIVER/GB)  Result Date: 09/06/2022 CLINICAL DATA:  Upper abdominal pain EXAM: ULTRASOUND ABDOMEN LIMITED RIGHT UPPER QUADRANT COMPARISON:  None Available. FINDINGS: Gallbladder: Multiple shadowing stones. Gallbladder sludge. Increased wall thickness up to 9.4 mm with trace pericholecystic fluid. Positive sonographic Murphy. Common bile duct: Diameter: 10.9 mm. At least 2 shadowing stones within the common bile duct, these measure 6 and 7 mm. Liver: Slightly echogenic liver parenchyma. No focal hepatic abnormality. Portal vein is patent on color Doppler imaging with normal  direction of blood flow towards the liver. Other: None. IMPRESSION: 1. Cholelithiasis with gallbladder wall thickening and positive sonographic Murphy, findings consistent with acute cholecystitis. 2. Dilated common bile duct with at least 2 stones within the common bile duct consistent with choledocholithiasis. 3. Slightly echogenic liver parenchyma consistent with steatosis. Electronically Signed   By: Jasmine Pang M.D.   On: 09/06/2022 20:48   DG Chest 2 View  Result Date: 09/06/2022 CLINICAL DATA:  Chest pain. EXAM: CHEST - 2 VIEW COMPARISON:  December 11, 2015 FINDINGS: The heart size and mediastinal contours are within normal limits. Both lungs are clear. Multilevel degenerative changes are seen involving the mid and lower thoracic spine. IMPRESSION: No active cardiopulmonary disease. Electronically Signed   By: Aram Candela M.D.   On: 09/06/2022 19:05    Anti-infectives: Anti-infectives (From admission, onward)    Start     Dose/Rate Route Frequency Ordered Stop   09/07/22 0800  cefTRIAXone (ROCEPHIN) 2 g in sodium chloride 0.9 % 100 mL IVPB        2 g 200 mL/hr over 30 Minutes Intravenous Daily 09/07/22 0735     09/07/22 0800  metroNIDAZOLE (FLAGYL) IVPB 500 mg        500 mg 100 mL/hr over 60 Minutes Intravenous 2 times daily 09/07/22 0735         Assessment/Plan: Cholecystitis - agree with Rocephin/Flagyl, IVF, bowel rest Choledocholithiasis - transferred here for GI evaluation and possible ERCP. GI ordered MRCP but she was too  claustrophobic to complete it. LFTs noted. Hypokalemia - TRH is repleting now  Plan - will proceed with laparoscopic cholecystectomy, cholangiogram today. I discussed the procedure, risks, and benefits with her. She is agreeable.     LOS: 1 day    Violeta Gelinas, MD, MPH, FACS Trauma & General Surgery Use [email or web address] to contact on call provider  09/08/2022

## 2022-09-08 NOTE — Plan of Care (Signed)

## 2022-09-08 NOTE — Progress Notes (Signed)
PROGRESS NOTE    LEVY SCOTTO  HYQ:[id number] DOB: 1975-04-02 DOA: 09/07/2022 PCP: Carlean Jews, NP   Brief Narrative:  47 y.o. female with medical history significant for essential pretension, depression, GERD, who is admitted to [facility] on 09/07/2022 by way of transfer from [facility] ED with choledocholithiasis after presenting from home to [facility] ED complaining of abdominal pain.  Right upper quadrant ultrasound showed cholelithiasis with acute cystitis.  Patient transferred from [facility] due to lack of ERCP capabilities to [facility].  Seen by GI and general surgery who plans on likely performing cholecystectomy with IOC today.   Assessment & Plan:  Principal Problem:   Choledocholithiasis Active Problems:   Essential hypertension   Right upper quadrant abdominal pain   Nausea   Depression   Osteoarthritis   GERD (gastroesophageal reflux disease)      Acute Cholecystitis with choledocholithiasis -Currently on empiric Rocephin and Flagyl.  GI and general surgery following.  IV fluids, NPO.  Likely cholecystectomy with IOC today.   Hypertension -Holding home p.o. meds.  IV as needed   Depression -Continue effexor, vraylar    Osteoarthritis  -Hold.  On hold.  As needed gabapentin   Migraine Headaches -As needed medications   GERD -PPI   Anemia of chronic disease, borderline low iron - Will give IV iron while here.  Hypokalemia - As needed repletion    DVT prophylaxis: Place and maintain sequential compression device Start: 09/07/22 1451 SCDs Start: 09/07/22 0304 Code Status: Full code Family Communication:   Status is: Inpatient Continue hospital stay in anticipation for surgery.       Diet Orders (From admission, onward)     Start     Ordered   09/08/22 0001  Diet NPO time specified  Diet effective midnight        09/07/22 1948            Subjective: Reporting of some right upper quadrant pain  Examination:  General  exam: Appears calm and comfortable  Respiratory system: Clear to auscultation. Respiratory effort normal. Cardiovascular system: S1 & S2 heard, RRR. No JVD, murmurs, rubs, gallops or clicks. No pedal edema. Gastrointestinal system: Right upper quadrant slightly tender to deep palpation Central nervous system: Alert and oriented. No focal neurological deficits. Extremities: Symmetric 5 x 5 power. Skin: No rashes, lesions or ulcers Psychiatry: Judgement and insight appear normal. Mood & affect appropriate.  Objective: Vitals:   09/07/22 1842 09/07/22 2002 09/08/22 0448 09/08/22 0450  BP: (!) 134/93 (!) 144/88  (!) 140/95  Pulse: 79 89  (!) 103  Resp: 18 18  18   Temp: 98.7 F (37.1 C) 99.1 F (37.3 C)  98.3 F (36.8 C)  TempSrc: Oral   Oral  SpO2:  99%  97%  Weight:   135.6 kg   Height:        Intake/Output Summary (Last 24 hours) at 09/08/2022 0751 Last data filed at 09/07/2022 2003 Gross per 24 hour  Intake 769.59 ml  Output --  Net 769.59 ml   Filed Weights   09/07/22 0300 09/08/22 0448  Weight: 132.8 kg 135.6 kg    Scheduled Meds:  cariprazine  1.5 mg Oral Daily   pantoprazole (PROTONIX) IV  40 mg Intravenous Q24H   sucralfate  1 g Oral QID   traZODone  100 mg Oral QHS   venlafaxine XR  225 mg Oral Q breakfast   Continuous Infusions:  cefTRIAXone (ROCEPHIN)  IV Stopped (09/07/22 1934)  dextrose 5% lactated ringers 125 mL/hr at 09/07/22 2048   metronidazole 500 mg (09/07/22 2050)    Nutritional status     Body mass index is 58.38 kg/m.  Data Reviewed:   CBC: Recent Labs  Lab 09/06/22 1710 09/07/22 0108 09/07/22 0557 09/08/22 0611  WBC 10.8* 11.0* 9.5 9.6  NEUTROABS  --  8.5* 6.3 7.8*  HGB 11.7* 11.0* 10.8* 10.7*  HCT 38.4 35.9* 35.5* 35.3*  MCV 82.2 82.0 81.1 81.5  PLT 347 295 260 301   Basic Metabolic Panel: Recent Labs  Lab 09/06/22 1710 09/07/22 0557  NA 139 138  K 3.6 3.8  CL 102 102  CO2 29 26  GLUCOSE 104* 96  BUN 14 8   CREATININE 0.82 0.76  CALCIUM 8.6* 8.5*  MG  --  1.8   GFR: Estimated Creatinine Clearance: 109.4 mL/min (by C-G formula based on SCr of 0.76 mg/dL). Liver Function Tests: Recent Labs  Lab 09/06/22 1710 09/07/22 0557  AST 17 15  ALT 27 26  ALKPHOS 70 69  BILITOT 0.2* 0.3  PROT 6.5 6.0*  ALBUMIN 3.4* 2.8*   Recent Labs  Lab 09/07/22 0557  LIPASE 27   No results for input(s): "AMMONIA" in the last 168 hours. Coagulation Profile: Recent Labs  Lab 09/07/22 0557  INR 1.1   Cardiac Enzymes: No results for input(s): "CKTOTAL", "CKMB", "CKMBINDEX", "TROPONINI" in the last 168 hours. BNP (last 3 results) No results for input(s): "PROBNP" in the last 8760 hours. HbA1C: No results for input(s): "HGBA1C" in the last 72 hours. CBG: No results for input(s): "GLUCAP" in the last 168 hours. Lipid Profile: No results for input(s): "CHOL", "HDL", "LDLCALC", "TRIG", "CHOLHDL", "LDLDIRECT" in the last 72 hours. Thyroid Function Tests: No results for input(s): "TSH", "T4TOTAL", "FREET4", "T3FREE", "THYROIDAB" in the last 72 hours. Anemia Panel: Recent Labs    09/07/22 1222  VITAMINB12 1,367*  FOLATE 8.4  FERRITIN 22  TIBC 342  IRON 23*  RETICCTPCT 1.4   Sepsis Labs: No results for input(s): "PROCALCITON", "LATICACIDVEN" in the last 168 hours.  No results found for this or any previous visit (from the past 240 hour(s)).       Radiology Studies: US Abdomen Limited RUQ (LIVER/GB)  Result Date: 09/06/2022 CLINICAL DATA:  Upper abdominal pain EXAM: ULTRASOUND ABDOMEN LIMITED RIGHT UPPER QUADRANT COMPARISON:  None Available. FINDINGS: Gallbladder: Multiple shadowing stones. Gallbladder sludge. Increased wall thickness up to 9.4 mm with trace pericholecystic fluid. Positive sonographic Murphy. Common bile duct: Diameter: 10.9 mm. At least 2 shadowing stones within the common bile duct, these measure 6 and 7 mm. Liver: Slightly echogenic liver parenchyma. No focal hepatic  abnormality. Portal vein is patent on color Doppler imaging with normal direction of blood flow towards the liver. Other: None. IMPRESSION: 1. Cholelithiasis with gallbladder wall thickening and positive sonographic Murphy, findings consistent with acute cholecystitis. 2. Dilated common bile duct with at least 2 stones within the common bile duct consistent with choledocholithiasis. 3. Slightly echogenic liver parenchyma consistent with steatosis. Electronically Signed   By: Jasmine Pang M.D.   On: 09/06/2022 20:48   DG Chest 2 View  Result Date: 09/06/2022 CLINICAL DATA:  Chest pain. EXAM: CHEST - 2 VIEW COMPARISON:  December 11, 2015 FINDINGS: The heart size and mediastinal contours are within normal limits. Both lungs are clear. Multilevel degenerative changes are seen involving the mid and lower thoracic spine. IMPRESSION: No active cardiopulmonary disease. Electronically Signed   By: Aram Candela M.D.   On:  09/06/2022 19:05           LOS: 1 day   Time spent= 35 mins    Lonia Roane Joline Maxcy, MD [facility]  If 7PM-7AM, please contact night-coverage  09/08/2022, 7:51 AM

## 2022-09-08 NOTE — Op Note (Signed)
09/07/2022 - 09/08/2022  3:33 PM  PATIENT:  Deanna Vazquez  47 y.o. female  PRE-OPERATIVE DIAGNOSIS:  CHOLECYSTITIS  POST-OPERATIVE DIAGNOSIS:  SEVERE CHOLECYSTITIS WITH HYDROPS OF THE GALLBLADDER  PROCEDURE:  Procedure(s): LAPAROSCOPIC CHOLECYSTECTOMY  SURGEON:  Surgeon(s): Violeta Gelinas, MD  ASSISTANTS: Trixie Deis, PA-C   ANESTHESIA:   local and general  EBL:  Total I/O In: 1000 [I.V.:1000] Out: -   BLOOD ADMINISTERED:none  DRAINS: (1) Jackson-Pratt drain(s) with closed bulb suction in the R abd    SPECIMEN:  Excision  DISPOSITION OF SPECIMEN:  PATHOLOGY  COUNTS:  YES  DICTATION: .Dragon Dictation Findings: Severe cholecystitis with hydrops of the gallbladder.  Unable to do a cholangiogram.  Procedure detail: Informed consent was obtained.  The patient is currently on IV antibiotics.  She was brought to the operating room and general endotracheal anesthesia was administered by the anesthesia staff.  Her abdomen was prepped and draped in sterile fashion.  Timeout procedure was performed.The supraumbilical region was infiltrated with local. Supraumbilical incision was made. Subcutaneous tissues were dissected down revealing the anterior fascia. This was divided sharply along the midline. Peritoneal cavity was entered under direct vision without complication. A 0 Vicryl pursestring was placed around the fascial opening. Hassan trocar was inserted into the abdomen. The abdomen was insufflated with carbon dioxide in standard fashion. Under direct vision a 5 mm epigastric and 5 mm right abdominal port x 2 were placed.  Local was used at each port site.  Laparoscopic exploration revealed a very inflamed and distended gallbladder.  It was aspirated with the Nahzat drainage catheter.  Aspiration revealed clear bile consistent with hydrops of the gallbladder.  The dome was then retracted superior and medially.  A lot of omental adhesions were swept down revealing the body and  eventually the infundibulum of the gallbladder.  There was severe inflammation down around the infundibulum and the duodenum was very stuck to the gallbladder.  This was gently dissected by it became clear that the inflammation in this area was too severe to safely dissect.  Decision was made to do a dome down approach.  The dome of the gallbladder was taken off of the liver bed attachments using cautery.  We continued to take the gallbladder off of the liver bed and got down to the cystic artery.  We could not get this securely clipped with a 5 mm clip so I upsized the epigastric port to an 11 mm port and used a larger clip applier.  The placed an additional right-sided 5 mm port to give some retraction of the omentum and colon safely away from the gallbladder dissection.  We did some further removal of the gallbladder off of the liver bed using cautery down to the infundibulum area where it was safe.  We placed 2 Endoloops around the gallbladder of PDS and tied them securely.  The gallbladder was then divided above these Endoloops and placed into a bag.  It was removed from the abdomen and sent to pathology.  The liver bed was cauterized to get excellent hemostasis.  The whole area was irrigated thoroughly and there was excellent hemostasis.  There was good closure of the infundibulum with the PDS Endoloops.  A 19 [place] Blake drain was placed and exited through the right lateral port site.  It was tied securely with nylon.  Four-quadrant inspection revealed no complicating features.  Ports were removed under direct vision.  Pneumoperitoneum was released.  Supraumbilical fascia was closed by tying the pursestring.  All 4 remaining wounds were irrigated and closed with 4-0 Vicryl.  All counts were correct.  She tolerated the procedure well without apparent complication was taken recovery in stable condition. PATIENT DISPOSITION:  PACU - hemodynamically stable.   Delay start of Pharmacological VTE agent (>24hrs)  due to surgical blood loss or risk of bleeding:  yes  Violeta Gelinas, MD, MPH, FACS Pager: [phone]  7/31/20243:33 PM

## 2022-09-08 NOTE — Transfer of Care (Signed)
Immediate Anesthesia Transfer of Care Note  Patient: Katrinna D Madura  Procedure(s) Performed: LAPAROSCOPIC CHOLECYSTECTOMY (Abdomen)  Patient Location: PACU  Anesthesia Type:General  Level of Consciousness: awake, alert , and oriented  Airway & Oxygen Therapy: Patient Spontanous Breathing and Patient connected to face mask oxygen  Post-op Assessment: Report given to RN, Post -op Vital signs reviewed and stable, and Patient moving all extremities X 4  Post vital signs: Reviewed and stable  Last Vitals:  Vitals Value Taken Time  BP 142/94 09/08/22 1605  Temp    Pulse 82 09/08/22 1609  Resp 14 09/08/22 1609  SpO2 97 % 09/08/22 1609  Vitals shown include unfiled device data.  Last Pain:  Vitals:   09/08/22 1151  TempSrc:   PainSc: 5       Patients Stated Pain Goal: 0 (09/08/22 1151)  Complications: No notable events documented.

## 2022-09-08 NOTE — Anesthesia Preprocedure Evaluation (Addendum)
Anesthesia Evaluation  Patient identified by MRN, date of birth, ID band Patient awake    Reviewed: Allergy & Precautions, H&P , NPO status , Patient's Chart, lab work & pertinent test results  History of Anesthesia Complications Negative for: history of anesthetic complications  Airway Mallampati: II   Neck ROM: full    Dental   Pulmonary asthma , neg COPD, Current Smoker   breath sounds clear to auscultation       Cardiovascular hypertension, (-) angina (-) CAD, (-) Past MI, (-) Cardiac Stents and (-) CABG  Rhythm:regular Rate:Normal     Neuro/Psych  Headaches PSYCHIATRIC DISORDERS Anxiety Depression     Neuromuscular disease    GI/Hepatic ,GERD  ,,(+) neg Cirrhosis        Endo/Other    Renal/GU Renal disease     Musculoskeletal  (+) Arthritis ,    Abdominal   Peds  Hematology  (+) Blood dyscrasia, anemia   Anesthesia Other Findings   Reproductive/Obstetrics                              Anesthesia Physical Anesthesia Plan  ASA: 2  Anesthesia Plan: General   Post-op Pain Management:    Induction: Intravenous  PONV Risk Score and Plan: 2 and Ondansetron, Dexamethasone, Midazolam and Treatment may vary due to age or medical condition  Airway Management Planned: Oral ETT  Additional Equipment:   Intra-op Plan:   Post-operative Plan: Extubation in OR  Informed Consent: I have reviewed the patients History and Physical, chart, labs and discussed the procedure including the risks, benefits and alternatives for the proposed anesthesia with the patient or authorized representative who has indicated his/her understanding and acceptance.     Dental advisory given  Plan Discussed with: CRNA, Anesthesiologist and Surgeon  Anesthesia Plan Comments:        Anesthesia Quick Evaluation

## 2022-09-09 ENCOUNTER — Encounter (HOSPITAL_COMMUNITY): Payer: Self-pay | Admitting: General Surgery

## 2022-09-09 DIAGNOSIS — K8050 Calculus of bile duct without cholangitis or cholecystitis without obstruction: Secondary | ICD-10-CM | POA: Diagnosis not present

## 2022-09-09 DIAGNOSIS — R1011 Right upper quadrant pain: Secondary | ICD-10-CM | POA: Diagnosis not present

## 2022-09-09 DIAGNOSIS — K819 Cholecystitis, unspecified: Secondary | ICD-10-CM | POA: Diagnosis not present

## 2022-09-09 MED ORDER — SODIUM CHLORIDE 0.9 % IV SOLN
250.0000 mg | Freq: Every day | INTRAVENOUS | Status: AC
  Filled 2022-09-09: qty 20

## 2022-09-09 MED ORDER — OXYCODONE HCL 5 MG PO TABS
5.0000 mg | ORAL_TABLET | ORAL | Status: DC | PRN
  Administered 2022-09-09 – 2022-09-10 (×2): 5 mg via ORAL
  Filled 2022-09-09 (×2): qty 1

## 2022-09-09 NOTE — Progress Notes (Signed)
PROGRESS NOTE    Deanna Vazquez  FAO:[id number] DOB: April 03, 1975 DOA: 09/07/2022 PCP: Carlean Jews, NP   Brief Narrative:  47 y.o. female with medical history significant for essential pretension, depression, GERD, who is admitted to [facility] on 09/07/2022 by way of transfer from [facility] ED with choledocholithiasis after presenting from home to [facility] ED complaining of abdominal pain.  Right upper quadrant ultrasound showed cholelithiasis with acute cystitis.  Patient transferred from [facility] due to lack of ERCP capabilities to [facility]  Seen by GI and general surgery, underwent cholecystectomy but unable to perform IOC.   Assessment & Plan:  Principal Problem:   Choledocholithiasis Active Problems:   Essential hypertension   Right upper quadrant abdominal pain   Nausea   Depression   Osteoarthritis   GERD (gastroesophageal reflux disease)      Acute Cholecystitis with choledocholithiasis status postcholecystectomy -Currently on empiric Rocephin and Flagyl.  Currently on low-fat diet, supportive care as necessary.  Followed by general surgery and GI.   Hypertension -Holding home p.o. meds.  IV as needed   Depression -Continue effexor, vraylar    Osteoarthritis  -Hold.  On hold.  As needed gabapentin   Migraine Headaches -As needed medications   GERD -PPI   Anemia of chronic disease, borderline low iron - Will give IV iron while here.  Hypokalemia - As needed repletion    DVT prophylaxis: Place and maintain sequential compression device Start: 09/07/22 1451 SCDs Start: 09/07/22 0304 Code Status: Full code Family Communication:   Status is: Inpatient Awaiting abdominal symptoms to slowly improve      Diet Orders (From admission, onward)     Start     Ordered   09/09/22 0959  Diet Heart Room service appropriate? Yes; Fluid consistency: Thin  Diet effective now       Question Answer Comment  Room service appropriate? Yes   Fluid  consistency: Thin      09/09/22 0958            Subjective: Continues to have right upper quadrant abdominal pain but some of it is postop surgical site pain Examination:  General exam: Appears calm and comfortable  Respiratory system: Clear to auscultation. Respiratory effort normal. Cardiovascular system: S1 & S2 heard, RRR. No JVD, murmurs, rubs, gallops or clicks. No pedal edema. Gastrointestinal system: Right upper quadrant tender to deep palpation Central nervous system: Alert and oriented. No focal neurological deficits. Extremities: Symmetric 5 x 5 power. Skin: No rashes, lesions or ulcers Psychiatry: Judgement and insight appear normal. Mood & affect appropriate.  Objective: Vitals:   09/09/22 0449 09/09/22 0648 09/09/22 0810 09/09/22 1207  BP: (!) 143/132  (!) 163/100 (!) 143/98  Pulse: 89  88 93  Resp: 18  18   Temp: 98.5 F (36.9 C)  98.4 F (36.9 C) 98.3 F (36.8 C)  TempSrc: Oral  Oral Oral  SpO2: 97%  94%   Weight:  135.8 kg    Height:        Intake/Output Summary (Last 24 hours) at 09/09/2022 1232 Last data filed at 09/09/2022 0600 Gross per 24 hour  Intake 1237 ml  Output 430 ml  Net 807 ml   Filed Weights   09/07/22 0300 09/08/22 0448 09/09/22 0648  Weight: 132.8 kg 135.6 kg 135.8 kg    Scheduled Meds:  cariprazine  1.5 mg Oral Daily   pantoprazole (PROTONIX) IV  40 mg Intravenous Q24H   sucralfate  1 g Oral QID  traZODone  100 mg Oral QHS   venlafaxine XR  225 mg Oral Q breakfast   Continuous Infusions:  cefTRIAXone (ROCEPHIN)  IV 2 g (09/09/22 1040)   dextrose 5% lactated ringers 125 mL/hr at 09/07/22 2048   ferric gluconate (FERRLECIT) IVPB     metronidazole 500 mg (09/09/22 1121)    Nutritional status     Body mass index is 58.47 kg/m.  Data Reviewed:   CBC: Recent Labs  Lab 09/06/22 1710 09/07/22 0108 09/07/22 0557 09/08/22 0611 09/09/22 0222  WBC 10.8* 11.0* 9.5 9.6 12.3*  NEUTROABS  --  8.5* 6.3 7.8*  --   HGB  11.7* 11.0* 10.8* 10.7* 10.0*  HCT 38.4 35.9* 35.5* 35.3* 33.2*  MCV 82.2 82.0 81.1 81.5 82.2  PLT 347 295 260 301 320   Basic Metabolic Panel: Recent Labs  Lab 09/06/22 1710 09/07/22 0557 09/08/22 0611 09/08/22 0810 09/09/22 0222  NA 139 138 137 138 138  K 3.6 3.8 3.4* 3.7 4.0  CL 102 102 100 102 103  CO2 29 26 28 24 25   GLUCOSE 104* 96 111* 106* 116*  BUN 14 8 9 9 9   CREATININE 0.82 0.76 0.84 0.79 0.74  CALCIUM 8.6* 8.5* 8.3* 8.3* 8.3*  MG  --  1.8 1.8  --  1.7  PHOS  --   --  3.2  --  2.9   GFR: Estimated Creatinine Clearance: 109.6 mL/min (by C-G formula based on SCr of 0.74 mg/dL). Liver Function Tests: Recent Labs  Lab 09/06/22 1710 09/07/22 0557 09/08/22 0611 09/08/22 0810 09/09/22 0222  AST 17 15 12* 14* 30  ALT 27 26 22 21  33  ALKPHOS 70 69 66 63 56  BILITOT 0.2* 0.3 0.3 0.2* 0.2*  PROT 6.5 6.0* 5.5* 5.5* 5.4*  ALBUMIN 3.4* 2.8* 2.5* 2.5* 2.5*   Recent Labs  Lab 09/07/22 0557  LIPASE 27   No results for input(s): "AMMONIA" in the last 168 hours. Coagulation Profile: Recent Labs  Lab 09/07/22 0557  INR 1.1   Cardiac Enzymes: No results for input(s): "CKTOTAL", "CKMB", "CKMBINDEX", "TROPONINI" in the last 168 hours. BNP (last 3 results) No results for input(s): "PROBNP" in the last 8760 hours. HbA1C: No results for input(s): "HGBA1C" in the last 72 hours. CBG: No results for input(s): "GLUCAP" in the last 168 hours. Lipid Profile: No results for input(s): "CHOL", "HDL", "LDLCALC", "TRIG", "CHOLHDL", "LDLDIRECT" in the last 72 hours. Thyroid Function Tests: No results for input(s): "TSH", "T4TOTAL", "FREET4", "T3FREE", "THYROIDAB" in the last 72 hours. Anemia Panel: Recent Labs    09/07/22 1222  VITAMINB12 1,367*  FOLATE 8.4  FERRITIN 22  TIBC 342  IRON 23*  RETICCTPCT 1.4   Sepsis Labs: No results for input(s): "PROCALCITON", "LATICACIDVEN" in the last 168 hours.  No results found for this or any previous visit (from the past 240  hour(s)).       Radiology Studies: No results found.         LOS: 2 days   Time spent= 35 mins    Carlette Palmatier Joline Maxcy, MD [facility]  If 7PM-7AM, please contact night-coverage  09/09/2022, 12:32 PM

## 2022-09-09 NOTE — Plan of Care (Signed)

## 2022-09-09 NOTE — Progress Notes (Signed)
Patient ID: Deanna Vazquez, female   DOB: January 12, 1976, 47 y.o.   MRN: [id number] 1 Day Post-Op    Subjective: Up in chair, RUQ pain better but still pain under R breast through to back. ROS negative except as listed above. Objective: Vital signs in last 24 hours: Temp:  [97 F (36.1 C)-98.5 F (36.9 C)] 98.4 F (36.9 C) (08/01 0810) Pulse Rate:  [78-107] 88 (08/01 0810) Resp:  [13-19] 18 (08/01 0810) BP: (134-164)/(81-132) 163/100 (08/01 0810) SpO2:  [92 %-100 %] 94 % (08/01 0810) Weight:  [135.8 kg] 135.8 kg (08/01 0648) Last BM Date : 09/04/22  Intake/Output from previous day: 07/31 0701 - 08/01 0700 In: 1237 [P.O.:237; I.V.:1000] Out: 430 [Drains:410; Blood:20] Intake/Output this shift: No intake/output data recorded.  General appearance: alert and cooperative GI: soft, incisions OK, JP serosanguinous  Lab Results: CBC  Recent Labs    09/08/22 0611 09/09/22 0222  WBC 9.6 12.3*  HGB 10.7* 10.0*  HCT 35.3* 33.2*  PLT 301 320   BMET Recent Labs    09/08/22 0810 09/09/22 0222  NA 138 138  K 3.7 4.0  CL 102 103  CO2 24 25  GLUCOSE 106* 116*  BUN 9 9  CREATININE 0.79 0.74  CALCIUM 8.3* 8.3*   PT/INR Recent Labs    09/07/22 0557  LABPROT 13.9  INR 1.1   ABG No results for input(s): "PHART", "HCO3" in the last 72 hours.  Invalid input(s): "PCO2", "PO2"  Studies/Results: No results found.  Anti-infectives: Anti-infectives (From admission, onward)    Start     Dose/Rate Route Frequency Ordered Stop   09/07/22 0800  cefTRIAXone (ROCEPHIN) 2 g in sodium chloride 0.9 % 100 mL IVPB        2 g 200 mL/hr over 30 Minutes Intravenous Daily 09/07/22 0735     09/07/22 0800  metroNIDAZOLE (FLAGYL) IVPB 500 mg        500 mg 100 mL/hr over 60 Minutes Intravenous 2 times daily 09/07/22 0735         Assessment/Plan: Severe cholecystitis with hydrops - S/P lap chole 7/31, could not do IOC, drain SS, continue Rocephin/Flagyl for now due to  severity Choledocholithiasis - while LFTs remain normal, GI considering ERCP tomorrow. Appreciate their help. Hypokalemia - improved  Plan - clears, GI F/U    LOS: 2 days    Violeta Gelinas, MD, MPH, FACS Trauma & General Surgery Use [email or web address] to contact on call provider  09/09/2022

## 2022-09-09 NOTE — Anesthesia Postprocedure Evaluation (Signed)
Anesthesia Post Note  Patient: Deanna Vazquez  Procedure(s) Performed: LAPAROSCOPIC CHOLECYSTECTOMY (Abdomen)     Patient location during evaluation: PACU Anesthesia Type: General Level of consciousness: awake and alert Pain management: pain level controlled Vital Signs Assessment: post-procedure vital signs reviewed and stable Respiratory status: spontaneous breathing, nonlabored ventilation, respiratory function stable and patient connected to nasal cannula oxygen Cardiovascular status: blood pressure returned to baseline and stable Postop Assessment: no apparent nausea or vomiting Anesthetic complications: no   No notable events documented.  Last Vitals:  Vitals:   09/09/22 0449 09/09/22 0810  BP: (!) 143/132 (!) 163/100  Pulse: 89 88  Resp: 18 18  Temp: 36.9 C 36.9 C  SpO2: 97% 94%    Last Pain:  Vitals:   09/09/22 0810  TempSrc: Oral  PainSc:                  Travin Marik S

## 2022-09-09 NOTE — Progress Notes (Addendum)
Gastroenterology Inpatient Follow Up    Subjective: Patient states that her right sided ab pain is slightly better but that her epigastric ab pain remains. She was able drink some broth last night but it did cause her pain to worsen slightly  Objective: Vital signs in last 24 hours: Temp:  [97 F (36.1 C)-98.5 F (36.9 C)] 98.4 F (36.9 C) (08/01 0810) Pulse Rate:  [78-107] 88 (08/01 0810) Resp:  [13-19] 18 (08/01 0810) BP: (134-164)/(81-132) 163/100 (08/01 0810) SpO2:  [92 %-100 %] 94 % (08/01 0810) Weight:  [135.8 kg] 135.8 kg (08/01 0648) Last BM Date : 09/04/22  Intake/Output from previous day: 07/31 0701 - 08/01 0700 In: 1237 [P.O.:237; I.V.:1000] Out: 430 [Drains:410; Blood:20] Intake/Output this shift: No intake/output data recorded.  General appearance: alert and cooperative Resp: No increased work of breathing Cardio: Regular rate GI: Tender to palpation in the right upper quadrant and epigastric area  Lab Results: Recent Labs    09/07/22 0557 09/08/22 0611 09/09/22 0222  WBC 9.5 9.6 12.3*  HGB 10.8* 10.7* 10.0*  HCT 35.5* 35.3* 33.2*  PLT 260 301 320   BMET Recent Labs    09/08/22 0611 09/08/22 0810 09/09/22 0222  NA 137 138 138  K 3.4* 3.7 4.0  CL 100 102 103  CO2 28 24 25   GLUCOSE 111* 106* 116*  BUN 9 9 9   CREATININE 0.84 0.79 0.74  CALCIUM 8.3* 8.3* 8.3*   LFT Recent Labs    09/06/22 1710 09/07/22 0557 09/09/22 0222  PROT 6.5   < > 5.4*  ALBUMIN 3.4*   < > 2.5*  AST 17   < > 30  ALT 27   < > 33  ALKPHOS 70   < > 56  BILITOT 0.2*   < > 0.2*  BILIDIR <0.1  --   --   IBILI NOT CALCULATED  --   --    < > = values in this interval not displayed.   PT/INR Recent Labs    09/07/22 0557  LABPROT 13.9  INR 1.1   Hepatitis Panel No results for input(s): "HEPBSAG", "HCVAB", "HEPAIGM", "HEPBIGM" in the last 72 hours. C-Diff No results for input(s): "CDIFFTOX" in the last 72 hours.  Studies/Results: No results  found.  Medications: I have reviewed the patient's current medications. Scheduled:  cariprazine  1.5 mg Oral Daily   pantoprazole (PROTONIX) IV  40 mg Intravenous Q24H   sucralfate  1 g Oral QID   traZODone  100 mg Oral QHS   venlafaxine XR  225 mg Oral Q breakfast   Continuous:  cefTRIAXone (ROCEPHIN)  IV 2 g (09/08/22 1101)   dextrose 5% lactated ringers 125 mL/hr at 09/07/22 2048   ferric gluconate (FERRLECIT) IVPB     metronidazole 500 mg (09/08/22 2117)   UJW:[id number] **OR** acetaminophen, guaiFENesin, hydrALAZINE, HYDROmorphone (DILAUDID) injection, ipratropium-albuterol, metoprolol tartrate, naLOXone (NARCAN)  injection, ondansetron (ZOFRAN) IV, senna-docusate, traZODone  Assessment/Plan: 47 year old female with history of asthma and obesity presented with RUQ ab pain. , found to have cholecystitis. RUQ U/S showed cholelithiasis with gallbladder wall thickening consistent with acute cholecystitis and dilated CBD with at least 2 stones within the CBD.  Surgery performed laparoscopic cholecystectomy yesterday. IOC was not able to be performed during the surgery. LFTs remain normal. I discussed this case with my biliary colleague Dr. Marina Goodell who recommended against ERCP for now. Patient does still have some ab pain, which may be postsurgical pain that will hopefully resolve with  time.  - Started on low fat diet - Continue to monitor pain for now - Consider interval imaging with EUS (limited availability to perform this as an inpatient, more likely would have to be done outpatient) or MRCP (with anxiety medications since patient is claustrophobic) if abdominal pain persists   LOS: 2 days   Imogene Burn 09/09/2022, 9:58 AM

## 2022-09-10 ENCOUNTER — Encounter (HOSPITAL_COMMUNITY)
Admission: RE | Disposition: A | Discharge status: Home / Self Care | Payer: Self-pay | Source: Home / Self Care | Attending: Internal Medicine

## 2022-09-10 ENCOUNTER — Telehealth: Payer: Self-pay

## 2022-09-10 DIAGNOSIS — K8050 Calculus of bile duct without cholangitis or cholecystitis without obstruction: Secondary | ICD-10-CM | POA: Diagnosis not present

## 2022-09-10 DIAGNOSIS — R1011 Right upper quadrant pain: Secondary | ICD-10-CM | POA: Diagnosis not present

## 2022-09-10 DIAGNOSIS — K819 Cholecystitis, unspecified: Secondary | ICD-10-CM | POA: Diagnosis not present

## 2022-09-10 SURGERY — ENDOSCOPIC RETROGRADE CHOLANGIOPANCREATOGRAPHY (ERCP)
Anesthesia: General

## 2022-09-10 MED ORDER — POLYETHYLENE GLYCOL 3350 17 G PO PACK
17.0000 g | PACK | Freq: Every day | ORAL | Status: DC
  Administered 2022-09-10 – 2022-09-11 (×2): 17 g via ORAL
  Filled 2022-09-10 (×2): qty 1

## 2022-09-10 MED ORDER — BUTALBITAL-APAP-CAFFEINE 50-325-40 MG PO TABS
1.0000 | ORAL_TABLET | Freq: Four times a day (QID) | ORAL | Status: DC | PRN
  Administered 2022-09-10 – 2022-09-11 (×2): 1 via ORAL
  Filled 2022-09-10 (×2): qty 1

## 2022-09-10 MED ORDER — PANTOPRAZOLE SODIUM 40 MG PO TBEC
40.0000 mg | DELAYED_RELEASE_TABLET | Freq: Every day | ORAL | Status: DC
  Administered 2022-09-11: 40 mg via ORAL
  Filled 2022-09-10: qty 1

## 2022-09-10 MED ORDER — IRBESARTAN 75 MG PO TABS
75.0000 mg | ORAL_TABLET | Freq: Every day | ORAL | Status: DC
  Administered 2022-09-10 – 2022-09-11 (×2): 75 mg via ORAL
  Filled 2022-09-10 (×2): qty 1

## 2022-09-10 MED ORDER — DOCUSATE SODIUM 100 MG PO CAPS
100.0000 mg | ORAL_CAPSULE | Freq: Two times a day (BID) | ORAL | Status: DC
  Administered 2022-09-10 – 2022-09-11 (×3): 100 mg via ORAL
  Filled 2022-09-10 (×3): qty 1

## 2022-09-10 NOTE — Telephone Encounter (Signed)
Called patient to schedule follow visit in office patient stated she will call back when ready to make an appointment gave patient phone number to call office.

## 2022-09-10 NOTE — Discharge Instructions (Signed)

## 2022-09-10 NOTE — Progress Notes (Addendum)
PROGRESS NOTE    Deanna Vazquez  QMV:[id number] DOB: 04-14-1975 DOA: 09/07/2022 PCP: Carlean Jews, NP   Brief Narrative:  47 y.o. female with medical history significant for essential pretension, depression, GERD, who is admitted to [facility] on 09/07/2022 by way of transfer from [facility] ED with choledocholithiasis after presenting from home to [facility] ED complaining of abdominal pain.  Right upper quadrant ultrasound showed cholelithiasis with acute cystitis.  Patient transferred from [facility] due to lack of ERCP capabilities to [facility].  Seen by GI and general surgery, underwent cholecystectomy but unable to perform IOC.   Assessment & Plan:  Principal Problem:   Choledocholithiasis Active Problems:   Essential hypertension   Right upper quadrant abdominal pain   Nausea   Depression   Osteoarthritis   GERD (gastroesophageal reflux disease)      Acute Cholecystitis with choledocholithiasis status postcholecystectomy -Currently on empiric Rocephin and Flagyl.  GI and general surgery following.  Slowly improving postop.  Diet as tolerated.  Generalized headache secondary to migraine - Will order Fioricet as needed   Hypertension - Will start her ARB, holding off on HCTZ.  IV as needed   Depression -Continue effexor, vraylar    Osteoarthritis  -On hold.  As needed gabapentin   Migraine Headaches -As needed medications   GERD -PPI   Anemia of chronic disease, borderline low iron - Will give IV iron while here.  Hypokalemia - As needed repletion    DVT prophylaxis: Place and maintain sequential compression device Start: 09/07/22 1451 SCDs Start: 09/07/22 0304 Code Status: Full code Family Communication:   Status is: Inpatient If continues to tolerate diet, hopefully we can discharge in next 24 to 48 hours     Diet Orders (From admission, onward)     Start     Ordered   09/10/22 0842  Diet Heart Room service appropriate? Yes; Fluid  consistency: Thin  Diet effective now       Question Answer Comment  Room service appropriate? Yes   Fluid consistency: Thin      09/10/22 0841            Subjective: Having significant headache, tells me that this is similar to her migraines in the past. Examination:  Constitutional: Not in acute distress Respiratory: Clear to auscultation bilaterally Cardiovascular: Normal sinus rhythm, no rubs Abdomen: Nontender nondistended good bowel sounds Musculoskeletal: No edema noted Skin: No rashes seen Neurologic: CN 2-12 grossly intact.  And nonfocal Psychiatric: Normal judgment and insight. Alert and oriented x 3. Normal mood.  Objective: Vitals:   09/09/22 2027 09/09/22 2038 09/10/22 0532 09/10/22 0751  BP: (!) 189/118 (!) 158/100 (!) 152/101 131/74  Pulse: (!) 107 97 99 97  Resp: 18   18  Temp: 98.7 F (37.1 C)  99.5 F (37.5 C) 98.2 F (36.8 C)  TempSrc: Oral  Oral   SpO2: 98%  95% 91%  Weight:      Height:        Intake/Output Summary (Last 24 hours) at 09/10/2022 1146 Last data filed at 09/10/2022 0537 Gross per 24 hour  Intake 1247.44 ml  Output 72 ml  Net 1175.44 ml   Filed Weights   09/07/22 0300 09/08/22 0448 09/09/22 0648  Weight: 132.8 kg 135.6 kg 135.8 kg    Scheduled Meds:  cariprazine  1.5 mg Oral Daily   docusate sodium  100 mg Oral BID   irbesartan  75 mg Oral Daily   [START ON 09/11/2022]  pantoprazole  40 mg Oral Daily   polyethylene glycol  17 g Oral Daily   sucralfate  1 g Oral QID   traZODone  100 mg Oral QHS   venlafaxine XR  225 mg Oral Q breakfast   Continuous Infusions:  cefTRIAXone (ROCEPHIN)  IV 2 g (09/10/22 1144)   metronidazole 500 mg (09/10/22 1024)    Nutritional status     Body mass index is 58.47 kg/m.  Data Reviewed:   CBC: Recent Labs  Lab 09/07/22 0108 09/07/22 0557 09/08/22 0611 09/09/22 0222 09/10/22 0627  WBC 11.0* 9.5 9.6 12.3* 11.6*  NEUTROABS 8.5* 6.3 7.8*  --   --   HGB 11.0* 10.8* 10.7* 10.0*  9.7*  HCT 35.9* 35.5* 35.3* 33.2* 31.8*  MCV 82.0 81.1 81.5 82.2 83.7  PLT 295 260 301 320 304   Basic Metabolic Panel: Recent Labs  Lab 09/07/22 0557 09/08/22 0611 09/08/22 0810 09/09/22 0222 09/10/22 0627  NA 138 137 138 138 136  K 3.8 3.4* 3.7 4.0 3.8  CL 102 100 102 103 100  CO2 26 28 24 25 25   GLUCOSE 96 111* 106* 116* 99  BUN 8 9 9 9 6   CREATININE 0.76 0.84 0.79 0.74 0.82  CALCIUM 8.5* 8.3* 8.3* 8.3* 8.2*  MG 1.8 1.8  --  1.7 1.8  PHOS  --  3.2  --  2.9  --    GFR: Estimated Creatinine Clearance: 106.9 mL/min (by C-G formula based on SCr of 0.82 mg/dL). Liver Function Tests: Recent Labs  Lab 09/07/22 0557 09/08/22 0611 09/08/22 0810 09/09/22 0222 09/10/22 0627  AST 15 12* 14* 30 23  ALT 26 22 21  33 26  ALKPHOS 69 66 63 56 59  BILITOT 0.3 0.3 0.2* 0.2* 0.5  PROT 6.0* 5.5* 5.5* 5.4* 5.3*  ALBUMIN 2.8* 2.5* 2.5* 2.5* 2.5*   Recent Labs  Lab 09/07/22 0557  LIPASE 27   No results for input(s): "AMMONIA" in the last 168 hours. Coagulation Profile: Recent Labs  Lab 09/07/22 0557  INR 1.1   Cardiac Enzymes: No results for input(s): "CKTOTAL", "CKMB", "CKMBINDEX", "TROPONINI" in the last 168 hours. BNP (last 3 results) No results for input(s): "PROBNP" in the last 8760 hours. HbA1C: No results for input(s): "HGBA1C" in the last 72 hours. CBG: No results for input(s): "GLUCAP" in the last 168 hours. Lipid Profile: No results for input(s): "CHOL", "HDL", "LDLCALC", "TRIG", "CHOLHDL", "LDLDIRECT" in the last 72 hours. Thyroid Function Tests: No results for input(s): "TSH", "T4TOTAL", "FREET4", "T3FREE", "THYROIDAB" in the last 72 hours. Anemia Panel: Recent Labs    09/07/22 1222  VITAMINB12 1,367*  FOLATE 8.4  FERRITIN 22  TIBC 342  IRON 23*  RETICCTPCT 1.4   Sepsis Labs: No results for input(s): "PROCALCITON", "LATICACIDVEN" in the last 168 hours.  No results found for this or any previous visit (from the past 240 hour(s)).        Radiology Studies: No results found.         LOS: 3 days   Time spent= 35 mins    Petula Rotolo Joline Maxcy, MD [facility]  If 7PM-7AM, please contact night-coverage  09/10/2022, 11:46 AM

## 2022-09-10 NOTE — Addendum Note (Signed)
Addendum  created 09/10/22 0645 by Achille Rich, MD   Attestation recorded in Intraprocedure, Intraprocedure Attestations filed

## 2022-09-10 NOTE — Progress Notes (Addendum)
    Gastroenterology Inpatient Follow Up    Subjective: Patient states that her ab pain has improved. She was able to eat some food yesterday and did not have worsening of her abdominal pain.   Objective: Vital signs in last 24 hours: Temp:  [98.2 F (36.8 C)-99.5 F (37.5 C)] 98.2 F (36.8 C) (08/02 0751) Pulse Rate:  [93-107] 97 (08/02 0751) Resp:  [18] 18 (08/02 0751) BP: (131-189)/(74-118) 131/74 (08/02 0751) SpO2:  [91 %-99 %] 91 % (08/02 0751) Last BM Date :  (prior to admission)  Intake/Output from previous day: 08/01 0701 - 08/02 0700 In: 1467.4 [P.O.:495; IV Piggyback:972.4] Out: 72 [Drains:72] Intake/Output this shift: No intake/output data recorded.  General appearance: alert and cooperative Resp: No increased work of breathing Cardio: Regular rate GI: Mildly tender in the RUQ  Lab Results: Recent Labs    09/08/22 0611 09/09/22 0222 09/10/22 0627  WBC 9.6 12.3* 11.6*  HGB 10.7* 10.0* 9.7*  HCT 35.3* 33.2* 31.8*  PLT 301 320 304   BMET Recent Labs    09/08/22 0810 09/09/22 0222 09/10/22 0627  NA 138 138 136  K 3.7 4.0 3.8  CL 102 103 100  CO2 24 25 25   GLUCOSE 106* 116* 99  BUN 9 9 6   CREATININE 0.79 0.74 0.82  CALCIUM 8.3* 8.3* 8.2*   LFT Recent Labs    09/10/22 0627  PROT 5.3*  ALBUMIN 2.5*  AST 23  ALT 26  ALKPHOS 59  BILITOT 0.5   PT/INR No results for input(s): "LABPROT", "INR" in the last 72 hours.  Hepatitis Panel No results for input(s): "HEPBSAG", "HCVAB", "HEPAIGM", "HEPBIGM" in the last 72 hours. C-Diff No results for input(s): "CDIFFTOX" in the last 72 hours.  Studies/Results: No results found.  Medications: I have reviewed the patient's current medications. Scheduled:  cariprazine  1.5 mg Oral Daily   irbesartan  75 mg Oral Daily   pantoprazole (PROTONIX) IV  40 mg Intravenous Q24H   sucralfate  1 g Oral QID   traZODone  100 mg Oral QHS   venlafaxine XR  225 mg Oral Q breakfast   Continuous:  cefTRIAXone  (ROCEPHIN)  IV Stopped (09/09/22 1110)   ferric gluconate (FERRLECIT) IVPB Stopped (09/09/22 1455)   ferric gluconate (FERRLECIT) IVPB     metronidazole Stopped (09/09/22 2144)   GNF:[id number] **OR** acetaminophen, guaiFENesin, hydrALAZINE, HYDROmorphone (DILAUDID) injection, ipratropium-albuterol, metoprolol tartrate, naLOXone (NARCAN)  injection, ondansetron (ZOFRAN) IV, oxyCODONE, senna-docusate, traZODone  Assessment/Plan: 47 year old female with history of asthma and obesity presented with RUQ ab pain. , found to have cholecystitis. RUQ U/S showed cholelithiasis with gallbladder wall thickening consistent with acute cholecystitis and dilated CBD with at least 2 stones within the CBD.  Surgery performed laparoscopic cholecystectomy 7/31, and IOC was not able to be performed during the surgery. LFTs remain normal. I discussed this case with my biliary colleague Dr. Marina Goodell who recommended against ERCP for now.  Patient has been able to tolerate a low-fat diet.  Perhaps the stones that were seen on her ultrasound initially have now passed.  I will arrange for outpatient GI follow-up to discuss the utility of a potential EUS vs. repeat ultrasound if the patient continues to have some abdominal pain.  - Started on low fat diet - We will arrange for outpatient GI follow-up.  GI will sign off for now.  Please call back if any new questions arise.   LOS: 3 days   Deanna Vazquez 09/10/2022, 8:48 AM

## 2022-09-10 NOTE — Progress Notes (Signed)
Progress Note  2 Days Post-Op  Subjective: Pt reports mild epigastric discomfort but RUQ pain is improving. She is tolerating liquids but does not have much appetite. Denies nausea or vomiting. Passing flatus, but no BM. Ambulating to bathroom.   Objective: Vital signs in last 24 hours: Temp:  [98.2 F (36.8 C)-99.5 F (37.5 C)] 98.2 F (36.8 C) (08/02 0751) Pulse Rate:  [93-107] 97 (08/02 0751) Resp:  [18] 18 (08/02 0751) BP: (131-189)/(74-118) 131/74 (08/02 0751) SpO2:  [91 %-99 %] 91 % (08/02 0751) Last BM Date :  (prior to admission)  Intake/Output from previous day: 08/01 0701 - 08/02 0700 In: 1467.4 [P.O.:495; IV Piggyback:972.4] Out: 72 [Drains:72] Intake/Output this shift: No intake/output data recorded.  PE: General: pleasant, WD, obese female who is sitting on side of bed in NAD HEENT: sclera anicteric  Lungs: Respiratory effort nonlabored Abd: soft, appropriately ttp, mild distention, JP with SS fluid, incisions C/D/I Psych: A&Ox3 with an appropriate affect.    Lab Results:  Recent Labs    09/09/22 0222 09/10/22 0627  WBC 12.3* 11.6*  HGB 10.0* 9.7*  HCT 33.2* 31.8*  PLT 320 304   BMET Recent Labs    09/09/22 0222 09/10/22 0627  NA 138 136  K 4.0 3.8  CL 103 100  CO2 25 25  GLUCOSE 116* 99  BUN 9 6  CREATININE 0.74 0.82  CALCIUM 8.3* 8.2*   PT/INR No results for input(s): "LABPROT", "INR" in the last 72 hours. CMP     Component Value Date/Time   NA 136 09/10/2022 0627   NA 140 11/16/2013 2104   K 3.8 09/10/2022 0627   K 3.8 11/16/2013 2104   CL 100 09/10/2022 0627   CL 105 11/16/2013 2104   CO2 25 09/10/2022 0627   CO2 29 11/16/2013 2104   GLUCOSE 99 09/10/2022 0627   GLUCOSE 106 (H) 11/16/2013 2104   BUN 6 09/10/2022 0627   BUN 9 11/16/2013 2104   CREATININE 0.82 09/10/2022 0627   CREATININE 0.65 11/16/2013 2104   CALCIUM 8.2 (L) 09/10/2022 0627   CALCIUM 8.6 11/16/2013 2104   PROT 5.3 (L) 09/10/2022 0627   ALBUMIN 2.5 (L)  09/10/2022 0627   AST 23 09/10/2022 0627   ALT 26 09/10/2022 0627   ALKPHOS 59 09/10/2022 0627   BILITOT 0.5 09/10/2022 0627   GFRNONAA >60 09/10/2022 0627   GFRNONAA >60 11/16/2013 2104   GFRAA >60 11/16/2013 2104   Lipase     Component Value Date/Time   LIPASE 27 09/07/2022 0557       Studies/Results: No results found.  Anti-infectives: Anti-infectives (From admission, onward)    Start     Dose/Rate Route Frequency Ordered Stop   09/07/22 0800  cefTRIAXone (ROCEPHIN) 2 g in sodium chloride 0.9 % 100 mL IVPB        2 g 200 mL/hr over 30 Minutes Intravenous Daily 09/07/22 0735     09/07/22 0800  metroNIDAZOLE (FLAGYL) IVPB 500 mg        500 mg 100 mL/hr over 60 Minutes Intravenous 2 times daily 09/07/22 9562          Assessment/Plan  Severe cholecystitis with hydrops Choledocholithiasis  POD2 s/p laparoscopic cholecystectomy 7/31 Dr. Janee Morn  - unable to get IOC, still having some epigastric abdominal discomfort, poor appetite but keeping liquids down and passing flatus  - stones noted in CBD on initial imaging but pt unable to tolerate MRCP, LFTs remain normal, GI not planning ERCP at this  time - drain SS - 72 cc/24h - hgb 9.7 from 10 - ok to start [facility] or LMWH today  - recommend 5 days total abx post-op from a gallbladder standpoint, defer to GI if further abx needed with possible retained CBD stones - ok to advance diet as tolerated from surgery standpoint - ok for DC from surgery standpoint if no other GI workup/procedures planned and tolerating diet. Will set up surgical follow up and she will need outpatient GI follow up as well  FEN: HH diet  VTE: ok to start SQH or LMWH today ID: [email or web address]  - per TRH -  HTN Chronic headache Chronic knee pain Asthma   LOS: 3 days     Juliet Rude, [facility] Surgery 09/10/2022, 9:35 AM Please see Amion for pager number during day hours 7:00am-4:30pm

## 2022-09-10 NOTE — Progress Notes (Signed)
PHARMACIST - PHYSICIAN COMMUNICATION  DR:   Reesa Chew  CONCERNING: IV to Oral Route Change Policy  RECOMMENDATION: This patient is receiving Protonix by the intravenous route.  Based on criteria approved by the Pharmacy and Therapeutics Committee, the intravenous medication(s) is/are being converted to the equivalent oral dose form(s).   DESCRIPTION: These criteria include: The patient is eating (either orally or via tube) and/or has been taking other orally administered medications for a least 24 hours The patient has no evidence of active gastrointestinal bleeding or impaired GI absorption (gastrectomy, short bowel, patient on TNA or NPO).  If you have questions about this conversion, please contact the Pharmacy Department  []   [phone] )  [facility] []   [phone] )  [facility] [x]   [phone] )  [facility] []   [phone] )  [facility] []   [phone] )  [facility]

## 2022-09-11 ENCOUNTER — Other Ambulatory Visit (HOSPITAL_COMMUNITY): Payer: Self-pay

## 2022-09-11 DIAGNOSIS — K8050 Calculus of bile duct without cholangitis or cholecystitis without obstruction: Secondary | ICD-10-CM | POA: Diagnosis not present

## 2022-09-11 LAB — COMPREHENSIVE METABOLIC PANEL WITH GFR
ALT: 20 U/L (ref 0–44)
AST: 11 U/L — ABNORMAL LOW (ref 15–41)
Albumin: 2.4 g/dL — ABNORMAL LOW (ref 3.5–5.0)
Alkaline Phosphatase: 56 U/L (ref 38–126)
Anion gap: 7 (ref 5–15)
BUN: 5 mg/dL — ABNORMAL LOW (ref 6–20)
CO2: 29 mmol/L (ref 22–32)
Calcium: 8.3 mg/dL — ABNORMAL LOW (ref 8.9–10.3)
Chloride: 102 mmol/L (ref 98–111)
Creatinine, Ser: 0.75 mg/dL (ref 0.44–1.00)
GFR, Estimated: >60 mL/min
Glucose, Bld: 100 mg/dL — ABNORMAL HIGH (ref 70–99)
Potassium: 3.3 mmol/L — ABNORMAL LOW (ref 3.5–5.1)
Sodium: 138 mmol/L (ref 135–145)
Total Bilirubin: 0.2 mg/dL — ABNORMAL LOW (ref 0.3–1.2)
Total Protein: 5.1 g/dL — ABNORMAL LOW (ref 6.5–8.1)

## 2022-09-11 MED ORDER — HYDROMORPHONE HCL 1 MG/ML IJ SOLN
0.5000 mg | INTRAMUSCULAR | Status: DC | PRN
  Administered 2022-09-11: 1 mg via INTRAVENOUS
  Filled 2022-09-11 (×2): qty 1

## 2022-09-11 MED ORDER — POTASSIUM CHLORIDE CRYS ER 20 MEQ PO TBCR
40.0000 meq | EXTENDED_RELEASE_TABLET | Freq: Once | ORAL | Status: AC
  Administered 2022-09-11: 40 meq via ORAL
  Filled 2022-09-11: qty 2

## 2022-09-11 MED ORDER — AMOXICILLIN-POT CLAVULANATE 875-125 MG PO TABS
1.0000 | ORAL_TABLET | Freq: Two times a day (BID) | ORAL | Status: DC
  Administered 2022-09-11: 1 via ORAL
  Filled 2022-09-11: qty 1

## 2022-09-11 MED ORDER — OXYCODONE HCL 5 MG PO TABS
5.0000 mg | ORAL_TABLET | Freq: Three times a day (TID) | ORAL | 0 refills | Status: DC | PRN
  Filled 2022-09-11: qty 10, 4d supply, fill #0

## 2022-09-11 MED ORDER — AMOXICILLIN-POT CLAVULANATE 875-125 MG PO TABS
1.0000 | ORAL_TABLET | Freq: Two times a day (BID) | ORAL | 0 refills | Status: AC
  Filled 2022-09-11: qty 8, 4d supply, fill #0

## 2022-09-11 MED ORDER — MAGNESIUM OXIDE -MG SUPPLEMENT 400 (240 MG) MG PO TABS
800.0000 mg | ORAL_TABLET | Freq: Once | ORAL | Status: AC
  Administered 2022-09-11: 800 mg via ORAL
  Filled 2022-09-11: qty 2

## 2022-09-11 NOTE — Progress Notes (Signed)
Assessment & Plan: Severe cholecystitis with hydrops, Choledocholithiasis  POD#3 - s/p laparoscopic cholecystectomy 7/31 Dr. Janee Morn  - tolerating diet, pain controlled  - drain serosanguinous, will leave for 5 more days and remove in office next week  - continue on oral abx's for 5 days more please - ok for discharge home today from surgery standpoint   FEN: HH diet  VTE: SQH or LMWH ID: [email or web address]   - per TRH -  HTN Chronic headache Chronic knee pain Asthma         Darnell Level, MD [facility] Surgery A [facility] practice Office: [phone]        Chief Complaint: cholecystitis  Subjective: Patient in bed, some back pain.  Tolerating diet.  Wants to go home today.  Objective: Vital signs in last 24 hours: Temp:  [97.9 F (36.6 C)-98.6 F (37 C)] 97.9 F (36.6 C) (08/03 0807) Pulse Rate:  [87-92] 87 (08/03 0807) Resp:  [16-18] 18 (08/03 0807) BP: (121-160)/(64-120) 154/92 (08/03 0807) SpO2:  [95 %-100 %] 100 % (08/03 0807) Weight:  [135 kg] 135 kg (08/03 0538) Last BM Date :  (prior to admission)  Intake/Output from previous day: 08/02 0701 - 08/03 0700 In: 474 [P.O.:474] Out: 30 [Drains:30] Intake/Output this shift: No intake/output data recorded.  Physical Exam: HEENT - sclerae clear, mucous membranes moist Neck - soft Abdomen - soft, obese; drain with thin serosanguinous output  Lab Results:  Recent Labs    09/10/22 0627 09/11/22 0447  WBC 11.6* 10.6*  HGB 9.7* 9.1*  HCT 31.8* 30.6*  PLT 304 290   BMET Recent Labs    09/10/22 0627 09/11/22 0447  NA 136 138  K 3.8 3.3*  CL 100 102  CO2 25 29  GLUCOSE 99 100*  BUN 6 5*  CREATININE 0.82 0.75  CALCIUM 8.2* 8.3*   PT/INR No results for input(s): "LABPROT", "INR" in the last 72 hours. Comprehensive Metabolic Panel:    Component Value Date/Time   NA 138 09/11/2022 0447   NA 136 09/10/2022 0627   NA 140 11/16/2013 2104   K 3.3 (L) 09/11/2022 0447   K 3.8 09/10/2022  0627   K 3.8 11/16/2013 2104   CL 102 09/11/2022 0447   CL 100 09/10/2022 0627   CL 105 11/16/2013 2104   CO2 29 09/11/2022 0447   CO2 25 09/10/2022 0627   CO2 29 11/16/2013 2104   BUN 5 (L) 09/11/2022 0447   BUN 6 09/10/2022 0627   BUN 9 11/16/2013 2104   CREATININE 0.75 09/11/2022 0447   CREATININE 0.82 09/10/2022 0627   CREATININE 0.65 11/16/2013 2104   GLUCOSE 100 (H) 09/11/2022 0447   GLUCOSE 99 09/10/2022 0627   GLUCOSE 106 (H) 11/16/2013 2104   CALCIUM 8.3 (L) 09/11/2022 0447   CALCIUM 8.2 (L) 09/10/2022 0627   CALCIUM 8.6 11/16/2013 2104   AST 11 (L) 09/11/2022 0447   AST 23 09/10/2022 0627   ALT 20 09/11/2022 0447   ALT 26 09/10/2022 0627   ALKPHOS 56 09/11/2022 0447   ALKPHOS 59 09/10/2022 0627   BILITOT 0.2 (L) 09/11/2022 0447   BILITOT 0.5 09/10/2022 0627   PROT 5.1 (L) 09/11/2022 0447   PROT 5.3 (L) 09/10/2022 0627   ALBUMIN 2.4 (L) 09/11/2022 0447   ALBUMIN 2.5 (L) 09/10/2022 0627    Studies/Results: No results found.    Darnell Level 09/11/2022  Patient ID: Deanna Vazquez, female   DOB: Jun 20, 1975, 47 y.o.   MRN: [id number]

## 2022-09-11 NOTE — Discharge Summary (Signed)
Physician Discharge Summary  Deanna Vazquez EXB:[id number] DOB: 1975-08-09 DOA: 09/07/2022  PCP: Carlean Jews, NP  Admit date: 09/07/2022 Discharge date: 09/11/2022  Admitted From: Home Disposition:  Home  Recommendations for Outpatient Follow-up:  Follow up with PCP in 1-2 weeks Please obtain BMP/CBC in one week your next doctors visit.  Pain medication prescribed 5 days of oral Augmentin Follow-up outpatient general surgery and get neurology   Discharge Condition: Stable CODE STATUS: Full code Diet recommendation: Low fat diet  Brief/Interim Summary:  47 y.o. female with medical history significant for essential pretension, depression, GERD, who is admitted to [facility] on 09/07/2022 by way of transfer from [facility] ED with choledocholithiasis after presenting from home to [facility] ED complaining of abdominal pain.  Right upper quadrant ultrasound showed cholelithiasis with acute cystitis.  Patient transferred from [facility] due to lack of ERCP capabilities to [facility].  Seen by GI and general surgery, underwent cholecystectomy but unable to perform IOC.  Patient slowly started tolerating oral without any issues.  GI recommended outpatient follow-up.  Medically stable for discharge today.     Assessment & Plan:  Principal Problem:   Choledocholithiasis Active Problems:   Essential hypertension   Right upper quadrant abdominal pain   Nausea   Depression   Osteoarthritis   GERD (gastroesophageal reflux disease)        Acute Cholecystitis with choledocholithiasis status postcholecystectomy - Underwent cholecystectomy.  Now doing well.  Seen by GI and general surgery.  Will transition oral antibiotics to 5 more days of oral Augmentin.  Follow-up patient general surgery and GI   Generalized headache secondary to migraine - Resolved with Fioricet   Hypertension - Resume home medications   Depression -Continue effexor, vraylar    Osteoarthritis  - Resume  home meds   Migraine Headaches -As needed medications   GERD -PPI    Anemia of chronic disease, borderline low iron - Follow-up outpatient.  Received iron during hospitalization   Hypokalemia - As needed repletion     Discharge Diagnoses:  Principal Problem:   Choledocholithiasis Active Problems:   Essential hypertension   Right upper quadrant abdominal pain   Nausea   Depression   Osteoarthritis   GERD (gastroesophageal reflux disease)      Consultations: Gastroenterology General surgery  Subjective: Doing well, wishes to go home  Discharge Exam: Vitals:   09/11/22 0300 09/11/22 0807  BP: 138/64 (!) 154/92  Pulse: 88 87  Resp: 16 18  Temp: 98.6 F (37 C) 97.9 F (36.6 C)  SpO2: 96% 100%   Vitals:   09/10/22 2100 09/11/22 0300 09/11/22 0538 09/11/22 0807  BP: (!) 160/90 138/64  (!) 154/92  Pulse:  88  87  Resp:  16  18  Temp:  98.6 F (37 C)  97.9 F (36.6 C)  TempSrc:  Oral  Oral  SpO2:  96%  100%  Weight:   135 kg   Height:        General: Pt is alert, awake, not in acute distress Cardiovascular: RRR, S1/S2 +, no rubs, no gallops Respiratory: CTA bilaterally, no wheezing, no rhonchi Abdominal: Soft, NT, ND, bowel sounds + Extremities: no edema, no cyanosis  Discharge Instructions   Allergies as of 09/11/2022       Reactions   Penicillins Nausea And Vomiting        Medication List     TAKE these medications    amoxicillin-clavulanate 875-125 MG tablet Commonly known as: AUGMENTIN Take 1  tablet by mouth every 12 (twelve) hours for 4 days.   diclofenac 75 MG EC tablet Commonly known as: VOLTAREN Take 75 mg by mouth 2 (two) times daily.   gabapentin 300 MG capsule Commonly known as: NEURONTIN Take 1 capsule by mouth 3 (three) times daily.   nystatin 100000 UNIT/ML suspension Commonly known as: MYCOSTATIN Take 5 mLs (500,000 Units total) by mouth 4 (four) times daily. For 5-7 days   olmesartan-hydrochlorothiazide 40-25 MG  tablet Commonly known as: BENICAR HCT TAKE 1 TABLET BY MOUTH EVERY DAY IN THE MORNING   oxyCODONE 5 MG immediate release tablet Commonly known as: Oxy IR/ROXICODONE Take 1 tablet (5 mg total) by mouth every 8 (eight) hours as needed for severe pain or breakthrough pain.   pantoprazole 40 MG tablet Commonly known as: PROTONIX TAKE 1 TABLET BY MOUTH EVERY DAY IN THE MORNING   sucralfate 1 g tablet Commonly known as: Carafate Take 1 tablet (1 g total) by mouth 4 (four) times daily.   traZODone 100 MG tablet Commonly known as: DESYREL Take 1 tablet (100 mg total) by mouth at bedtime.   Ubrelvy 50 MG Tabs Generic drug: Ubrogepant Take by mouth as directed.   venlafaxine XR 75 MG 24 hr capsule Commonly known as: EFFEXOR-XR TAKE 3 CAPSULES BY MOUTH EVERY DAY   Vraylar 1.5 MG capsule Generic drug: cariprazine Take 1.5 mg by mouth daily.   Wegovy 0.25 MG/0.5ML Soaj Generic drug: Museum/gallery exhibitions officer ONE PER EACH WEEK        Follow-up Information     [facility], [place], [place]. Go on 09/17/2022.   Specialty: General Surgery Why: 8:45 AM, please arrive 30 min prior to appointment time to check in. Contact information: [address] [facility] [place] [address] [phone]         Carlean Jews, NP Follow up in 1 week(s).   Specialty: Family Medicine Contact information: [address] [place] [address] [phone]                Allergies  Allergen Reactions   Penicillins Nausea And Vomiting    You were cared for by a hospitalist during your hospital stay. If you have any questions about your discharge medications or the care you received while you were in the hospital after you are discharged, you can call the unit and asked to speak with the hospitalist on call if the hospitalist that took care of you is not available. Once you are discharged, your primary care physician will handle any further  medical issues. Please note that no refills for any discharge medications will be authorized once you are discharged, as it is imperative that you return to your primary care physician (or establish a relationship with a primary care physician if you do not have one) for your aftercare needs so that they can reassess your need for medications and monitor your lab values.  You were cared for by a hospitalist during your hospital stay. If you have any questions about your discharge medications or the care you received while you were in the hospital after you are discharged, you can call the unit and asked to speak with the hospitalist on call if the hospitalist that took care of you is not available. Once you are discharged, your primary care physician will handle any further medical issues. Please note that NO REFILLS for any discharge medications will be authorized once you are discharged, as it is imperative that  you return to your primary care physician (or establish a relationship with a primary care physician if you do not have one) for your aftercare needs so that they can reassess your need for medications and monitor your lab values.  Please request your Prim.MD to go over all Hospital Tests and Procedure/Radiological results at the follow up, please get all Hospital records sent to your Prim MD by signing hospital release before you go home.  Get CBC, CMP, 2 view Chest X ray checked  by Primary MD during your next visit or SNF MD in 5-7 days ( we routinely change or add medications that can affect your baseline labs and fluid status, therefore we recommend that you get the mentioned basic workup next visit with your PCP, your PCP may decide not to get them or add new tests based on their clinical decision)  On your next visit with your primary care physician please Get Medicines reviewed and adjusted.  If you experience worsening of your admission symptoms, develop shortness of breath, life  threatening emergency, suicidal or homicidal thoughts you must seek medical attention immediately by calling 911 or calling your MD immediately  if symptoms less severe.  You Must read complete instructions/literature along with all the possible adverse reactions/side effects for all the Medicines you take and that have been prescribed to you. Take any new Medicines after you have completely understood and accpet all the possible adverse reactions/side effects.   Do not drive, operate heavy machinery, perform activities at heights, swimming or participation in water activities or provide baby sitting services if your were admitted for syncope or siezures until you have seen by Primary MD or a Neurologist and advised to do so again.  Do not drive when taking Pain medications.   Procedures/Studies: US Abdomen Limited RUQ (LIVER/GB)  Result Date: 09/06/2022 CLINICAL DATA:  Upper abdominal pain EXAM: ULTRASOUND ABDOMEN LIMITED RIGHT UPPER QUADRANT COMPARISON:  None Available. FINDINGS: Gallbladder: Multiple shadowing stones. Gallbladder sludge. Increased wall thickness up to 9.4 mm with trace pericholecystic fluid. Positive sonographic Murphy. Common bile duct: Diameter: 10.9 mm. At least 2 shadowing stones within the common bile duct, these measure 6 and 7 mm. Liver: Slightly echogenic liver parenchyma. No focal hepatic abnormality. Portal vein is patent on color Doppler imaging with normal direction of blood flow towards the liver. Other: None. IMPRESSION: 1. Cholelithiasis with gallbladder wall thickening and positive sonographic Murphy, findings consistent with acute cholecystitis. 2. Dilated common bile duct with at least 2 stones within the common bile duct consistent with choledocholithiasis. 3. Slightly echogenic liver parenchyma consistent with steatosis. Electronically Signed   By: Jasmine Pang M.D.   On: 09/06/2022 20:48   DG Chest 2 View  Result Date: 09/06/2022 CLINICAL DATA:  Chest pain.  EXAM: CHEST - 2 VIEW COMPARISON:  December 11, 2015 FINDINGS: The heart size and mediastinal contours are within normal limits. Both lungs are clear. Multilevel degenerative changes are seen involving the mid and lower thoracic spine. IMPRESSION: No active cardiopulmonary disease. Electronically Signed   By: Aram Candela M.D.   On: 09/06/2022 19:05     The results of significant diagnostics from this hospitalization (including imaging, microbiology, ancillary and laboratory) are listed below for reference.     Microbiology: No results found for this or any previous visit (from the past 240 hour(s)).   Labs: BNP (last 3 results) No results for input(s): "BNP" in the last 8760 hours. Basic Metabolic Panel: Recent Labs  Lab 09/07/22 0557 09/08/22  2956 09/08/22 0810 09/09/22 0222 09/10/22 0627 09/11/22 0447  NA 138 137 138 138 136 138  K 3.8 3.4* 3.7 4.0 3.8 3.3*  CL 102 100 102 103 100 102  CO2 26 28 24 25 25 29   GLUCOSE 96 111* 106* 116* 99 100*  BUN 8 9 9 9 6  5*  CREATININE 0.76 0.84 0.79 0.74 0.82 0.75  CALCIUM 8.5* 8.3* 8.3* 8.3* 8.2* 8.3*  MG 1.8 1.8  --  1.7 1.8 1.8  PHOS  --  3.2  --  2.9  --   --    Liver Function Tests: Recent Labs  Lab 09/08/22 0611 09/08/22 0810 09/09/22 0222 09/10/22 0627 09/11/22 0447  AST 12* 14* 30 23 11*  ALT 22 21 33 26 20  ALKPHOS 66 63 56 59 56  BILITOT 0.3 0.2* 0.2* 0.5 0.2*  PROT 5.5* 5.5* 5.4* 5.3* 5.1*  ALBUMIN 2.5* 2.5* 2.5* 2.5* 2.4*   Recent Labs  Lab 09/07/22 0557  LIPASE 27   No results for input(s): "AMMONIA" in the last 168 hours. CBC: Recent Labs  Lab 09/07/22 0108 09/07/22 0557 09/08/22 0611 09/09/22 0222 09/10/22 0627 09/11/22 0447  WBC 11.0* 9.5 9.6 12.3* 11.6* 10.6*  NEUTROABS 8.5* 6.3 7.8*  --   --   --   HGB 11.0* 10.8* 10.7* 10.0* 9.7* 9.1*  HCT 35.9* 35.5* 35.3* 33.2* 31.8* 30.6*  MCV 82.0 81.1 81.5 82.2 83.7 82.0  PLT 295 260 301 320 304 290   Cardiac Enzymes: No results for input(s):  "CKTOTAL", "CKMB", "CKMBINDEX", "TROPONINI" in the last 168 hours. BNP: Invalid input(s): "POCBNP" CBG: No results for input(s): "GLUCAP" in the last 168 hours. D-Dimer No results for input(s): "DDIMER" in the last 72 hours. Hgb A1c No results for input(s): "HGBA1C" in the last 72 hours. Lipid Profile No results for input(s): "CHOL", "HDL", "LDLCALC", "TRIG", "CHOLHDL", "LDLDIRECT" in the last 72 hours. Thyroid function studies No results for input(s): "TSH", "T4TOTAL", "T3FREE", "THYROIDAB" in the last 72 hours.  Invalid input(s): "FREET3" Anemia work up No results for input(s): "VITAMINB12", "FOLATE", "FERRITIN", "TIBC", "IRON", "RETICCTPCT" in the last 72 hours. Urinalysis    Component Value Date/Time   COLORURINE Straw 11/16/2013 2110   COLORURINE YELLOW 01/22/2010 2232   APPEARANCEUR Clear 11/16/2013 2110   LABSPEC 1.005 11/16/2013 2110   PHURINE 6.0 11/16/2013 2110   PHURINE 5.5 01/22/2010 2232   GLUCOSEU Negative 11/16/2013 2110   HGBUR Negative 11/16/2013 2110   HGBUR NEGATIVE 01/22/2010 2232   BILIRUBINUR Negative 11/16/2013 2110   KETONESUR Negative 11/16/2013 2110   KETONESUR NEGATIVE 01/22/2010 2232   PROTEINUR Negative 11/16/2013 2110   PROTEINUR NEGATIVE 01/22/2010 2232   UROBILINOGEN 0.2 01/22/2010 2232   NITRITE Negative 11/16/2013 2110   NITRITE NEGATIVE 01/22/2010 2232   LEUKOCYTESUR Negative 11/16/2013 2110   Sepsis Labs Recent Labs  Lab 09/08/22 0611 09/09/22 0222 09/10/22 0627 09/11/22 0447  WBC 9.6 12.3* 11.6* 10.6*   Microbiology No results found for this or any previous visit (from the past 240 hour(s)).   Time coordinating discharge:  I have spent 35 minutes face to face with the patient and on the ward discussing the patients care, assessment, plan and disposition with other care givers. >50% of the time was devoted counseling the patient about the risks and benefits of treatment/Discharge disposition and coordinating care.    SIGNED:   Dimple Nanas, MD  [facility] 09/11/2022, 11:28 AM   If 7PM-7AM, please contact night-coverage

## 2022-09-13 ENCOUNTER — Telehealth: Payer: Self-pay

## 2022-09-13 NOTE — Transitions of Care (Post Inpatient/ED Visit) (Unsigned)
   09/13/2022  Name: Deanna Vazquez MRN: [id number] DOB: [birth year]  Today's TOC FU Call Status: Today's TOC FU Call Status:: Unsuccessful Call (1st Attempt) Unsuccessful Call (1st Attempt) Date: 09/13/22  Attempted to reach the patient regarding the most recent Inpatient/ED visit.  Follow Up Plan: Additional outreach attempts will be made to reach the patient to complete the Transitions of Care (Post Inpatient/ED visit) call.   Signature Karena Addison, LPN [facility] Nurse Health Advisor Direct Dial [phone]

## 2022-09-14 NOTE — Transitions of Care (Post Inpatient/ED Visit) (Signed)
   09/14/2022  Name: Deanna Vazquez MRN: [id number] DOB: 06-Feb-1976  Today's TOC FU Call Status: Today's TOC FU Call Status:: Unsuccessful Call (2nd Attempt) Unsuccessful Call (1st Attempt) Date: 09/13/22 Unsuccessful Call (2nd Attempt) Date: 09/14/22  Attempted to reach the patient regarding the most recent Inpatient/ED visit.  Follow Up Plan: Additional outreach attempts will be made to reach the patient to complete the Transitions of Care (Post Inpatient/ED visit) call.   Signature Karena Addison, LPN [facility] Nurse Health Advisor Direct Dial [phone]

## 2022-09-15 NOTE — Transitions of Care (Post Inpatient/ED Visit) (Signed)
09/15/2022  Name: Deanna Vazquez MRN: [id number] DOB: 09-30-1975  Today's TOC FU Call Status: Today's TOC FU Call Status:: Successful TOC FU Call Completed Unsuccessful Call (1st Attempt) Date: 09/13/22 Unsuccessful Call (2nd Attempt) Date: 09/14/22 [facility] FU Call Complete Date: 09/15/22  Transition Care Management Follow-up Telephone Call Date of Discharge: 09/11/22 Discharge Facility: [facility]) Type of Discharge: Inpatient Admission Primary Inpatient Discharge Diagnosis:: calculus of bile duct How have you been since you were released from the hospital?: Better Any questions or concerns?: No  Items Reviewed: Did you receive and understand the discharge instructions provided?: Yes Medications obtained,verified, and reconciled?: Yes (Medications Reviewed) Any new allergies since your discharge?: No Dietary orders reviewed?: Yes Do you have support at home?: Yes People in Home: spouse  Medications Reviewed Today: Medications Reviewed Today     Reviewed by Karena Addison, LPN (Licensed Practical Nurse) on 09/15/22 at 1619  Med List Status: <None>   Medication Order Taking? Sig Documenting Provider Last Dose Status Informant  amoxicillin-clavulanate (AUGMENTIN) 875-125 MG tablet [id number]  Take 1 tablet by mouth every 12 (twelve) hours for 4 days. Dimple Nanas, MD  Active   diclofenac (VOLTAREN) 75 MG EC tablet [id number] No Take 75 mg by mouth 2 (two) times daily. [provider] 09/06/2022 Active Self  gabapentin (NEURONTIN) 300 MG capsule [id number] No Take 1 capsule by mouth 3 (three) times daily. [provider] Past Week Active Self  nystatin (MYCOSTATIN) 100000 UNIT/ML suspension [id number] No Take 5 mLs (500,000 Units total) by mouth 4 (four) times daily. For 5-7 days  Patient not taking: Reported on 09/06/2022   Freddy Finner, NP Not Taking Active Self  olmesartan-hydrochlorothiazide [facility] HCT) 40-25 MG tablet [id number] No TAKE 1 TABLET BY  MOUTH EVERY DAY IN THE Gustavus Messing, MD 09/06/2022 Active Self  oxyCODONE (OXY IR/ROXICODONE) 5 MG immediate release tablet [id number]  Take 1 tablet (5 mg total) by mouth every 8 (eight) hours as needed for severe pain or breakthrough pain. Dimple Nanas, MD  Active   pantoprazole (PROTONIX) 40 MG tablet [id number] No TAKE 1 TABLET BY MOUTH EVERY DAY IN THE MORNING Sherron Monday, MD 09/06/2022 Active Self  Semaglutide-Weight Management (WEGOVY) 0.25 MG/0.5ML Ivory Broad [id number] No INJECT ONE PER EACH WEEK  Patient not taking: Reported on 09/07/2022   Orson Eva, NP Not Taking Active Self           Med Note Kandis Cocking Alinda Dooms A   Tue Sep 07, 2022 12:23 PM) Pt was prescribed but hasn't been able to get it due to insurance.  sucralfate (CARAFATE) 1 g tablet [id number] No Take 1 tablet (1 g total) by mouth 4 (four) times daily. Sherron Monday, MD 09/06/2022 Expired 09/07/22 2359 Self  traZODone (DESYREL) 100 MG tablet [id number] No Take 1 tablet (100 mg total) by mouth at bedtime. Orson Eva, NP 09/05/2022 Expired 09/07/22 2359 Self  UBRELVY 50 MG TABS [id number] No Take by mouth as directed. [provider] 09/05/2022 Active Self  venlafaxine XR (EFFEXOR-XR) 75 MG 24 hr capsule [id number] No TAKE 3 CAPSULES BY MOUTH EVERY DAY Boswell, Chelsa, NP 09/06/2022 Active Self  VRAYLAR 1.5 MG capsule [id number] No Take 1.5 mg by mouth daily. [provider] 09/06/2022 Active Self            Home Care and Equipment/Supplies: Were Home Health Services Ordered?: NA Any new equipment or medical supplies ordered?: NA  Functional Questionnaire: Do you need assistance with bathing/showering or  dressing?: No Do you need assistance with meal preparation?: No Do you need assistance with eating?: No Do you have difficulty maintaining continence: No Do you have difficulty managing or taking your medications?: No  Follow up appointments reviewed: PCP Follow-up  appointment confirmed?: Yes Date of PCP follow-up appointment?: 09/29/22 Follow-up Provider: [facility] Follow-up appointment confirmed?: Yes Date of Specialist follow-up appointment?: 09/17/22 Follow-Up Specialty Provider:: surgeon Do you need transportation to your follow-up appointment?: No Do you understand care options if your condition(s) worsen?: Yes-patient verbalized understanding    SIGNATURE Karena Addison, LPN [facility] Nurse Health Advisor Direct Dial [phone]

## 2022-09-18 ENCOUNTER — Other Ambulatory Visit: Payer: Self-pay | Admitting: Nurse Practitioner

## 2022-09-18 ENCOUNTER — Other Ambulatory Visit: Payer: Self-pay | Admitting: Internal Medicine

## 2022-09-18 DIAGNOSIS — G43109 Migraine with aura, not intractable, without status migrainosus: Secondary | ICD-10-CM

## 2022-09-18 DIAGNOSIS — F418 Other specified anxiety disorders: Secondary | ICD-10-CM

## 2022-09-20 ENCOUNTER — Ambulatory Visit: Payer: Commercial Managed Care - PPO | Admitting: Internal Medicine

## 2022-09-22 ENCOUNTER — Other Ambulatory Visit: Payer: Self-pay | Admitting: Internal Medicine

## 2022-09-22 ENCOUNTER — Telehealth: Payer: Self-pay | Admitting: Family Medicine

## 2022-09-22 DIAGNOSIS — F32A Depression, unspecified: Secondary | ICD-10-CM

## 2022-09-22 MED ORDER — VRAYLAR 1.5 MG PO CAPS
1.5000 mg | ORAL_CAPSULE | Freq: Every day | ORAL | 0 refills | Status: AC
Start: 2022-09-22 — End: 2022-10-22

## 2022-09-22 NOTE — Telephone Encounter (Signed)
Patient called in requesting refills of Vraylar. Advised her she needs an appt before we can fill this. Can you please send a 30 DS to get her through for now?

## 2022-09-29 ENCOUNTER — Inpatient Hospital Stay: Payer: Commercial Managed Care - PPO | Admitting: Family Medicine

## 2022-09-29 NOTE — Progress Notes (Deleted)
Established Patient Office Visit  Subjective   Patient ID: Deanna PEREZPEREZ, female    DOB: 1975-10-19  Age: 47 y.o. MRN: [id number]  No chief complaint on file.   HPI Hosp f/u - choledocholithiasis  The ASCVD Risk score (Arnett DK, et al., 2019) failed to calculate for the following reasons:   Cannot find a previous HDL lab   Cannot find a previous total cholesterol lab  Health Maintenance Due  Topic Date Due   HIV Screening  Never done   Hepatitis C Screening  Never done   DTaP/Tdap/Td (1 - Tdap) Never done   PAP SMEAR-Modifier  Never done   Colonoscopy  Never done   COVID-19 Vaccine (2 - 2023-24 season) 10/09/2021   INFLUENZA VACCINE  09/09/2022      Objective:     There were no vitals taken for this visit. {Vitals History (Optional):23777}  Physical Exam   No results found for any visits on 09/29/22.      Assessment & Plan:   There are no diagnoses linked to this encounter.   No follow-ups on file.    Sandre Kitty, MD

## 2022-10-05 ENCOUNTER — Ambulatory Visit: Payer: Commercial Managed Care - PPO | Admitting: Internal Medicine

## 2022-10-15 ENCOUNTER — Other Ambulatory Visit: Payer: Self-pay | Admitting: Internal Medicine

## 2022-11-08 ENCOUNTER — Other Ambulatory Visit: Payer: Self-pay

## 2022-11-08 DIAGNOSIS — K922 Gastrointestinal hemorrhage, unspecified: Secondary | ICD-10-CM

## 2022-11-10 ENCOUNTER — Other Ambulatory Visit: Payer: Self-pay | Admitting: Internal Medicine

## 2022-11-10 DIAGNOSIS — K922 Gastrointestinal hemorrhage, unspecified: Secondary | ICD-10-CM

## 2022-11-12 MED ORDER — PANTOPRAZOLE SODIUM 40 MG PO TBEC
40.0000 mg | DELAYED_RELEASE_TABLET | Freq: Every day | ORAL | 0 refills | Status: DC
Start: 2022-11-12 — End: 2022-11-22

## 2022-11-22 ENCOUNTER — Ambulatory Visit: Payer: Commercial Managed Care - PPO | Admitting: Internal Medicine

## 2022-11-22 ENCOUNTER — Encounter: Payer: Self-pay | Admitting: Internal Medicine

## 2022-11-22 VITALS — BP 160/110 | Ht 60.0 in | Wt 301.4 lb

## 2022-11-22 DIAGNOSIS — K922 Gastrointestinal hemorrhage, unspecified: Secondary | ICD-10-CM

## 2022-11-22 DIAGNOSIS — F5101 Primary insomnia: Secondary | ICD-10-CM

## 2022-11-22 DIAGNOSIS — F411 Generalized anxiety disorder: Secondary | ICD-10-CM

## 2022-11-22 DIAGNOSIS — I10 Essential (primary) hypertension: Secondary | ICD-10-CM | POA: Diagnosis not present

## 2022-11-22 DIAGNOSIS — F32A Depression, unspecified: Secondary | ICD-10-CM | POA: Diagnosis not present

## 2022-11-22 DIAGNOSIS — K219 Gastro-esophageal reflux disease without esophagitis: Secondary | ICD-10-CM | POA: Diagnosis not present

## 2022-11-22 DIAGNOSIS — F17210 Nicotine dependence, cigarettes, uncomplicated: Secondary | ICD-10-CM

## 2022-11-22 DIAGNOSIS — F418 Other specified anxiety disorders: Secondary | ICD-10-CM

## 2022-11-22 MED ORDER — TRAZODONE HCL 100 MG PO TABS: 100.0000 mg | ORAL_TABLET | Freq: Every day | ORAL | 2 refills | Status: DC

## 2022-11-22 MED ORDER — OLMESARTAN MEDOXOMIL-HCTZ 40-25 MG PO TABS: 1.0000 | ORAL_TABLET | Freq: Every day | ORAL | 0 refills | Status: DC

## 2022-11-22 MED ORDER — CARIPRAZINE HCL 1.5 MG PO CAPS: 1.5000 mg | ORAL_CAPSULE | Freq: Every day | ORAL | 2 refills | Status: DC

## 2022-11-22 MED ORDER — VENLAFAXINE HCL ER 75 MG PO CP24
ORAL_CAPSULE | ORAL | 1 refills | Status: DC
Start: 2022-11-22 — End: 2023-05-30

## 2022-11-22 MED ORDER — PANTOPRAZOLE SODIUM 40 MG PO TBEC
40.0000 mg | DELAYED_RELEASE_TABLET | Freq: Every day | ORAL | 0 refills | Status: DC
Start: 2022-11-22 — End: 2023-04-04

## 2022-11-22 NOTE — Progress Notes (Signed)
Established Patient Office Visit  Subjective:  Patient ID: Deanna Vazquez, female    DOB: Nov 26, 1975  Age: 47 y.o. MRN: [id number]  Chief Complaint  Patient presents with   Follow-up    Medication refill    No new complaints, here for medication refills. BP elevated as she stopped taking her antihypertensives 2 wks ago.   No other concerns at this time.   Past Medical History:  Diagnosis Date   Asthma    Chronic headache    Chronic knee pain    Hypertension     Past Surgical History:  Procedure Laterality Date   CESAREAN SECTION     CHOLECYSTECTOMY N/A 09/08/2022   Procedure: LAPAROSCOPIC CHOLECYSTECTOMY;  Surgeon: Violeta Gelinas, MD;  Location: [facility] OR;  Service: General;  Laterality: N/A;    Social History   Socioeconomic History   Marital status: Divorced    Spouse name: Not on file   Number of children: Not on file   Years of education: Not on file   Highest education level: Not on file  Occupational History   Not on file  Tobacco Use   Smoking status: Every Day    Current packs/day: 1.00    Types: Cigarettes   Smokeless tobacco: Never  Vaping Use   Vaping status: Never Used  Substance and Sexual Activity   Alcohol use: No   Drug use: No   Sexual activity: Not on file  Other Topics Concern   Not on file  Social History Narrative   Not on file   Social Determinants of Health   Financial Resource Strain: Not on file  Food Insecurity: No Food Insecurity (09/07/2022)   Hunger Vital Sign    Worried About Running Out of Food in the Last Year: Never true    Ran Out of Food in the Last Year: Never true  Transportation Needs: No Transportation Needs (09/07/2022)   PRAPARE - Administrator, Civil Service (Medical): No    Lack of Transportation (Non-Medical): No  Physical Activity: Not on file  Stress: Not on file  Social Connections: Not on file  Intimate Partner Violence: Not At Risk (09/07/2022)   Humiliation, Afraid, Rape, and Kick  questionnaire    Fear of Current or Ex-Partner: No    Emotionally Abused: No    Physically Abused: No    Sexually Abused: No    Family History  Problem Relation Age of Onset   Hypertension Mother    Diabetes Mother    Hyperlipidemia Mother    Cancer Mother    Stroke Mother    COPD Father     Allergies  Allergen Reactions   Penicillins Nausea And Vomiting    ROS     Objective:   BP (!) 160/110   Ht 5' (1.524 m)   Wt (!) 301 lb 6.4 oz (136.7 kg)   BMI 58.86 kg/m   Vitals:   11/22/22 1450  BP: (!) 160/110  Height: 5' (1.524 m)  Weight: (!) 301 lb 6.4 oz (136.7 kg)  BMI (Calculated): 58.86    Physical Exam Vitals reviewed.  Constitutional:      General: She is not in acute distress.    Appearance: She is obese.  HENT:     Head: Normocephalic.     Nose: Nose normal.     Mouth/Throat:     Mouth: Mucous membranes are moist.  Eyes:     Extraocular Movements: Extraocular movements intact.     Pupils: Pupils  are equal, round, and reactive to light.  Cardiovascular:     Rate and Rhythm: Normal rate and regular rhythm.     Heart sounds: No murmur heard. Pulmonary:     Effort: Pulmonary effort is normal.     Breath sounds: No rhonchi or rales.  Abdominal:     General: Abdomen is flat.     Palpations: There is no hepatomegaly, splenomegaly or mass.  Musculoskeletal:        General: Normal range of motion.     Cervical back: Normal range of motion. No tenderness.  Skin:    General: Skin is warm and dry.  Neurological:     General: No focal deficit present.     Mental Status: She is alert and oriented to person, place, and time.     Cranial Nerves: No cranial nerve deficit.     Motor: No weakness.  Psychiatric:        Mood and Affect: Mood normal.        Behavior: Behavior normal.      No results found for any visits on 11/22/22.     Assessment & Plan:  As per problem list.  Problem List Items Addressed This Visit       Cardiovascular and  Mediastinum   Essential hypertension   Relevant Medications   olmesartan-hydrochlorothiazide (BENICAR HCT) 40-25 MG tablet     Digestive   GERD (gastroesophageal reflux disease)   Relevant Medications   pantoprazole (PROTONIX) 40 MG tablet   Other Relevant Orders   Ambulatory referral to Gastroenterology     Other   GAD (generalized anxiety disorder)   Relevant Medications   traZODone (DESYREL) 100 MG tablet   venlafaxine XR (EFFEXOR-XR) 75 MG 24 hr capsule   Depression - Primary   Relevant Medications   traZODone (DESYREL) 100 MG tablet   venlafaxine XR (EFFEXOR-XR) 75 MG 24 hr capsule   Other Visit Diagnoses     Gastrointestinal hemorrhage, unspecified       Relevant Medications   pantoprazole (PROTONIX) 40 MG tablet   Primary insomnia       Relevant Medications   traZODone (DESYREL) 100 MG tablet   Other specified anxiety disorders       Relevant Medications   traZODone (DESYREL) 100 MG tablet   venlafaxine XR (EFFEXOR-XR) 75 MG 24 hr capsule   cariprazine (VRAYLAR) 1.5 MG capsule   Essential (primary) hypertension       Relevant Medications   olmesartan-hydrochlorothiazide (BENICAR HCT) 40-25 MG tablet       Return in about 4 weeks (around 12/20/2022) for BP followup.   Total time spent: 20 minutes  Luna Fuse, MD  11/22/2022   This document may have been prepared by [facility] Voice Recognition software and as such may include unintentional dictation errors.

## 2022-12-15 ENCOUNTER — Ambulatory Visit: Payer: Commercial Managed Care - PPO | Admitting: Internal Medicine

## 2022-12-16 ENCOUNTER — Other Ambulatory Visit: Payer: Self-pay | Admitting: Internal Medicine

## 2022-12-16 DIAGNOSIS — F5101 Primary insomnia: Secondary | ICD-10-CM

## 2022-12-25 ENCOUNTER — Other Ambulatory Visit: Payer: Self-pay | Admitting: Internal Medicine

## 2023-01-24 ENCOUNTER — Ambulatory Visit: Payer: Commercial Managed Care - PPO | Admitting: Internal Medicine

## 2023-02-08 ENCOUNTER — Other Ambulatory Visit: Payer: Self-pay | Admitting: Internal Medicine

## 2023-02-08 DIAGNOSIS — K219 Gastro-esophageal reflux disease without esophagitis: Secondary | ICD-10-CM

## 2023-02-22 ENCOUNTER — Ambulatory Visit: Payer: Commercial Managed Care - PPO | Admitting: Internal Medicine

## 2023-02-22 VITALS — BP 128/82 | HR 103 | Temp 97.4°F | Ht 60.0 in | Wt 305.8 lb

## 2023-02-22 DIAGNOSIS — Z6841 Body Mass Index (BMI) 40.0 and over, adult: Secondary | ICD-10-CM

## 2023-02-22 DIAGNOSIS — I10 Essential (primary) hypertension: Secondary | ICD-10-CM | POA: Diagnosis not present

## 2023-02-22 DIAGNOSIS — G5702 Lesion of sciatic nerve, left lower limb: Secondary | ICD-10-CM | POA: Diagnosis not present

## 2023-02-22 MED ORDER — PHENDIMETRAZINE TARTRATE 35 MG PO TABS: 1.0000 | ORAL_TABLET | Freq: Three times a day (TID) | ORAL | 0 refills | Status: DC

## 2023-02-22 MED ORDER — WEGOVY 0.25 MG/0.5ML ~~LOC~~ SOAJ
0.2500 mg | SUBCUTANEOUS | 1 refills | Status: AC
Start: 2023-02-22 — End: 2023-04-19

## 2023-02-22 MED ORDER — LIDOCAINE 5 % EX PTCH: 1.0000 | MEDICATED_PATCH | Freq: Two times a day (BID) | CUTANEOUS | 2 refills | Status: AC

## 2023-02-22 NOTE — Progress Notes (Signed)
 Established Patient Office Visit  Subjective:  Patient ID: Deanna Vazquez, female    DOB: [birth year]  Age: 48 y.o. MRN: [id number]  Chief Complaint  Patient presents with   Follow-up    Medication refills    No new complaints, here for lab review and medication refills. BP has normalized, started stricter diet and increased her exercise although yet to lose weight.    No other concerns at this time.   Past Medical History:  Diagnosis Date   Asthma    Chronic headache    Chronic knee pain    Hypertension     Past Surgical History:  Procedure Laterality Date   CESAREAN SECTION     CHOLECYSTECTOMY N/A 09/08/2022   Procedure: LAPAROSCOPIC CHOLECYSTECTOMY;  Surgeon: Sebastian Moles, MD;  Location: [facility] OR;  Service: General;  Laterality: N/A;    Social History   Socioeconomic History   Marital status: Divorced    Spouse name: Not on file   Number of children: Not on file   Years of education: Not on file   Highest education level: Not on file  Occupational History   Not on file  Tobacco Use   Smoking status: Every Day    Current packs/day: 1.00    Types: Cigarettes   Smokeless tobacco: Never  Vaping Use   Vaping status: Never Used  Substance and Sexual Activity   Alcohol use: No   Drug use: No   Sexual activity: Not on file  Other Topics Concern   Not on file  Social History Narrative   Not on file   Social Drivers of Health   Financial Resource Strain: Not on file  Food Insecurity: No Food Insecurity (09/07/2022)   Hunger Vital Sign    Worried About Running Out of Food in the Last Year: Never true    Ran Out of Food in the Last Year: Never true  Transportation Needs: No Transportation Needs (09/07/2022)   PRAPARE - Administrator, Civil Service (Medical): No    Lack of Transportation (Non-Medical): No  Physical Activity: Not on file  Stress: Not on file  Social Connections: Not on file  Intimate Partner Violence: Not At Risk (09/07/2022)    Humiliation, Afraid, Rape, and Kick questionnaire    Fear of Current or Ex-Partner: No    Emotionally Abused: No    Physically Abused: No    Sexually Abused: No    Family History  Problem Relation Age of Onset   Hypertension Mother    Diabetes Mother    Hyperlipidemia Mother    Cancer Mother    Stroke Mother    COPD Father     Allergies  Allergen Reactions   Penicillins Nausea And Vomiting    Outpatient Medications Prior to Visit  Medication Sig   diclofenac (VOLTAREN) 75 MG EC tablet Take 75 mg by mouth 2 (two) times daily.   gabapentin (NEURONTIN) 300 MG capsule Take 1 capsule by mouth 3 (three) times daily.   olmesartan -hydrochlorothiazide  (BENICAR  HCT) 40-25 MG tablet Take 1 tablet by mouth daily.   pantoprazole  (PROTONIX ) 40 MG tablet Take 1 tablet (40 mg total) by mouth daily.   sucralfate  (CARAFATE ) 1 g tablet TAKE 1 TABLET BY MOUTH 4 TIMES A DAY   traZODone  (DESYREL ) 100 MG tablet TAKE 1 TABLET BY MOUTH EVERYDAY AT BEDTIME   Ubrogepant (UBRELVY) 50 MG TABS USE AS DIRECTED TAKE ONE DAILY AS NEEDED FOR MIGRAINE HEADACHES, REPEAT ONCE AFTER 2 HOURS IF HEADACHE  PERSISTS   venlafaxine  XR (EFFEXOR -XR) 75 MG 24 hr capsule TAKE 3 CAPSULES BY MOUTH EVERY DAY   [DISCONTINUED] Semaglutide -Weight Management (WEGOVY ) 0.25 MG/0.5ML SOAJ INJECT ONE PER EACH WEEK (Patient not taking: Reported on 09/07/2022)   No facility-administered medications prior to visit.    Review of Systems  Constitutional:  Negative for weight loss.  Cardiovascular:  Negative for chest pain and palpitations.  All other systems reviewed and are negative.      Objective:   BP 128/82   Pulse (!) 103   Temp (!) 97.4 F (36.3 C)   Ht 5' (1.524 m)   Wt (!) 305 lb 12.8 oz (138.7 kg)   SpO2 95%   BMI 59.72 kg/m   Vitals:   02/22/23 1529  BP: 128/82  Pulse: (!) 103  Temp: (!) 97.4 F (36.3 C)  Height: 5' (1.524 m)  Weight: (!) 305 lb 12.8 oz (138.7 kg)  SpO2: 95%  BMI (Calculated): 59.72     Physical Exam Vitals reviewed.  Constitutional:      General: She is not in acute distress.    Appearance: She is obese.  HENT:     Head: Normocephalic.     Nose: Nose normal.     Mouth/Throat:     Mouth: Mucous membranes are moist.  Eyes:     Extraocular Movements: Extraocular movements intact.     Pupils: Pupils are equal, round, and reactive to light.  Cardiovascular:     Rate and Rhythm: Normal rate and regular rhythm.     Heart sounds: No murmur heard. Pulmonary:     Effort: Pulmonary effort is normal.     Breath sounds: No rhonchi or rales.  Abdominal:     General: Abdomen is flat.     Palpations: There is no hepatomegaly, splenomegaly or mass.  Musculoskeletal:        General: Normal range of motion.     Cervical back: Normal range of motion. No tenderness.  Skin:    General: Skin is warm and dry.  Neurological:     General: No focal deficit present.     Mental Status: She is alert and oriented to person, place, and time.     Cranial Nerves: No cranial nerve deficit.     Motor: No weakness.  Psychiatric:        Mood and Affect: Mood normal.        Behavior: Behavior normal.      No results found for any visits on 02/22/23.  No results found for this or any previous visit (from the past 2160 hours).    Assessment & Plan:  As per problem list  Problem List Items Addressed This Visit       Cardiovascular and Mediastinum   Essential hypertension - Primary     Nervous and Auditory   Piriformis syndrome of left side   Relevant Medications   Semaglutide -Weight Management (WEGOVY ) 0.25 MG/0.5ML SOAJ   Phendimetrazine  Tartrate 35 MG TABS     Other   Morbid obesity with BMI of 50.0-59.9, adult (HCC)   Relevant Medications   Semaglutide -Weight Management (WEGOVY ) 0.25 MG/0.5ML SOAJ   Phendimetrazine  Tartrate 35 MG TABS    Return in about 1 month (around 03/25/2023).   Total time spent: 20 minutes  Sherrill Cinderella Perry, MD  02/22/2023   This  document may have been prepared by [facility] Voice Recognition software and as such may include unintentional dictation errors.

## 2023-02-25 NOTE — Telephone Encounter (Signed)
[id number]

## 2023-02-28 ENCOUNTER — Telehealth: Payer: Self-pay

## 2023-02-28 NOTE — Telephone Encounter (Signed)
Pt called requesting information on her wegovy PA, will look into and try and get an approval or answer for pt-HQ

## 2023-03-22 ENCOUNTER — Ambulatory Visit: Payer: Commercial Managed Care - PPO | Admitting: Internal Medicine

## 2023-04-02 ENCOUNTER — Other Ambulatory Visit: Payer: Self-pay | Admitting: Internal Medicine

## 2023-04-02 DIAGNOSIS — K922 Gastrointestinal hemorrhage, unspecified: Secondary | ICD-10-CM

## 2023-04-06 ENCOUNTER — Other Ambulatory Visit: Payer: Self-pay | Admitting: Internal Medicine

## 2023-04-06 DIAGNOSIS — F418 Other specified anxiety disorders: Secondary | ICD-10-CM

## 2023-04-12 ENCOUNTER — Ambulatory Visit: Payer: Commercial Managed Care - PPO | Admitting: Internal Medicine

## 2023-04-27 ENCOUNTER — Telehealth: Payer: Self-pay | Admitting: Internal Medicine

## 2023-04-27 NOTE — Telephone Encounter (Signed)
 Patient requesting we do a PA for her new insurance for [employer] ID: [id number] RxBIN: [id number] PCN: [id number]

## 2023-05-05 ENCOUNTER — Other Ambulatory Visit: Payer: Self-pay

## 2023-05-06 NOTE — Telephone Encounter (Signed)
 Pt is now requesting for Zepbound to be sent to her pharmacy- I informed [place] and will sent it in when you return. Reginal Lutes was denied as well as other injectables-please advise

## 2023-05-24 ENCOUNTER — Other Ambulatory Visit: Payer: Self-pay | Admitting: Internal Medicine

## 2023-05-24 MED ORDER — TIRZEPATIDE-WEIGHT MANAGEMENT 2.5 MG/0.5ML ~~LOC~~ SOLN: 2.5000 mg | SUBCUTANEOUS | 1 refills | Status: DC

## 2023-05-27 ENCOUNTER — Other Ambulatory Visit: Payer: Self-pay | Admitting: Internal Medicine

## 2023-05-27 DIAGNOSIS — F418 Other specified anxiety disorders: Secondary | ICD-10-CM

## 2023-05-30 ENCOUNTER — Other Ambulatory Visit: Payer: Self-pay | Admitting: Internal Medicine

## 2023-05-30 ENCOUNTER — Other Ambulatory Visit: Payer: Self-pay

## 2023-05-30 DIAGNOSIS — F418 Other specified anxiety disorders: Secondary | ICD-10-CM

## 2023-05-30 DIAGNOSIS — F5101 Primary insomnia: Secondary | ICD-10-CM

## 2023-05-30 DIAGNOSIS — K922 Gastrointestinal hemorrhage, unspecified: Secondary | ICD-10-CM

## 2023-05-30 MED ORDER — TRAZODONE HCL 100 MG PO TABS: 100.0000 mg | ORAL_TABLET | Freq: Every day | ORAL | 1 refills | Status: DC

## 2023-05-30 MED ORDER — PANTOPRAZOLE SODIUM 40 MG PO TBEC: 40.0000 mg | DELAYED_RELEASE_TABLET | Freq: Every day | ORAL | 0 refills | Status: DC

## 2023-06-17 ENCOUNTER — Ambulatory Visit: Admitting: Internal Medicine

## 2023-06-21 ENCOUNTER — Ambulatory Visit: Admitting: Internal Medicine

## 2023-06-21 ENCOUNTER — Other Ambulatory Visit: Payer: Self-pay | Admitting: Internal Medicine

## 2023-06-21 VITALS — BP 122/80 | HR 106 | Temp 97.1°F | Ht 60.0 in | Wt 302.4 lb

## 2023-06-21 DIAGNOSIS — F418 Other specified anxiety disorders: Secondary | ICD-10-CM | POA: Diagnosis not present

## 2023-06-21 DIAGNOSIS — I10 Essential (primary) hypertension: Secondary | ICD-10-CM | POA: Diagnosis not present

## 2023-06-21 DIAGNOSIS — F5101 Primary insomnia: Secondary | ICD-10-CM | POA: Diagnosis not present

## 2023-06-21 DIAGNOSIS — Z6841 Body Mass Index (BMI) 40.0 and over, adult: Secondary | ICD-10-CM

## 2023-06-21 MED ORDER — CARIPRAZINE HCL 1.5 MG PO CAPS: 1.5000 mg | ORAL_CAPSULE | Freq: Every day | ORAL | 2 refills | Status: DC

## 2023-06-21 MED ORDER — OLMESARTAN MEDOXOMIL-HCTZ 40-25 MG PO TABS
1.0000 | ORAL_TABLET | Freq: Every day | ORAL | 0 refills | Status: DC
Start: 2023-06-21 — End: 2023-12-21

## 2023-06-21 NOTE — Progress Notes (Signed)
 Established Patient Office Visit  Subjective:  Patient ID: Deanna Vazquez, female    DOB: 1975/05/02  Age: 48 y.o. MRN: [id number]  Chief Complaint  Patient presents with  . Follow-up    Med refills and follow up    No new complaints, here for lab review and medication refills. Reports daytime somnolence with morning headache.   No other concerns at this time.   Past Medical History:  Diagnosis Date  . Asthma   . Chronic headache   . Chronic knee pain   . Hypertension     Past Surgical History:  Procedure Laterality Date  . CESAREAN SECTION    . CHOLECYSTECTOMY N/A 09/08/2022   Procedure: LAPAROSCOPIC CHOLECYSTECTOMY;  Surgeon: Dorena Gander, MD;  Location: [facility] OR;  Service: General;  Laterality: N/A;    Social History   Socioeconomic History  . Marital status: Divorced    Spouse name: Not on file  . Number of children: Not on file  . Years of education: Not on file  . Highest education level: Not on file  Occupational History  . Not on file  Tobacco Use  . Smoking status: Every Day    Current packs/day: 1.00    Types: Cigarettes  . Smokeless tobacco: Never  Vaping Use  . Vaping status: Never Used  Substance and Sexual Activity  . Alcohol use: No  . Drug use: No  . Sexual activity: Not on file  Other Topics Concern  . Not on file  Social History Narrative  . Not on file   Social Drivers of Health   Financial Resource Strain: Not on file  Food Insecurity: No Food Insecurity (09/07/2022)   Hunger Vital Sign   . Worried About Programme researcher, broadcasting/film/video in the Last Year: Never true   . Ran Out of Food in the Last Year: Never true  Transportation Needs: No Transportation Needs (09/07/2022)   PRAPARE - Transportation   . Lack of Transportation (Medical): No   . Lack of Transportation (Non-Medical): No  Physical Activity: Not on file  Stress: Not on file  Social Connections: Not on file  Intimate Partner Violence: Not At Risk (09/07/2022)   Humiliation,  Afraid, Rape, and Kick questionnaire   . Fear of Current or Ex-Partner: No   . Emotionally Abused: No   . Physically Abused: No   . Sexually Abused: No    Family History  Problem Relation Age of Onset  . Hypertension Mother   . Diabetes Mother   . Hyperlipidemia Mother   . Cancer Mother   . Stroke Mother   . COPD Father     Allergies  Allergen Reactions  . Penicillins Nausea And Vomiting    Outpatient Medications Prior to Visit  Medication Sig Note  . diclofenac (VOLTAREN) 75 MG EC tablet Take 75 mg by mouth 2 (two) times daily. 06/21/2023: Make sure they are from our facility.  Aaron Aas gabapentin (NEURONTIN) 300 MG capsule Take 1 capsule by mouth 3 (three) times daily. 06/21/2023: Make sure RX is from us .  . pantoprazole  (PROTONIX ) 40 MG tablet Take 1 tablet (40 mg total) by mouth daily.   . sucralfate  (CARAFATE ) 1 g tablet TAKE 1 TABLET BY MOUTH 4 TIMES A DAY   . traZODone  (DESYREL ) 100 MG tablet Take 1 tablet (100 mg total) by mouth at bedtime.   Aaron Aas UBRELVY 50 MG TABS USE AS DIRECTED TAKE ONE DAILY AS NEEDED FOR MIGRAINE HEADACHES, REPEAT ONCE AFTER 2 HOURS IF HEADACHE PERSISTS   .  venlafaxine  XR (EFFEXOR -XR) 75 MG 24 hr capsule TAKE 3 CAPSULES BY MOUTH EVERY DAY   . [DISCONTINUED] olmesartan -hydrochlorothiazide  (BENICAR  HCT) 40-25 MG tablet Take 1 tablet by mouth daily.   . [DISCONTINUED] tirzepatide (ZEPBOUND) 2.5 MG/0.5ML injection vial Inject 2.5 mg into the skin once a week.   . [DISCONTINUED] VRAYLAR  1.5 MG capsule TAKE 1 CAPSULE BY MOUTH DAILY.    No facility-administered medications prior to visit.    Review of Systems  Constitutional:  Negative for weight loss.  Cardiovascular:  Negative for chest pain and palpitations.  All other systems reviewed and are negative.      Objective:   BP 122/80   Pulse (!) 106   Temp (!) 97.1 F (36.2 C)   Ht 5' (1.524 m)   Wt (!) 302 lb 6.4 oz (137.2 kg)   SpO2 (!) 88%   BMI 59.06 kg/m   Vitals:   06/21/23 1517  BP:  122/80  Pulse: (!) 106  Temp: (!) 97.1 F (36.2 C)  Height: 5' (1.524 m)  Weight: (!) 302 lb 6.4 oz (137.2 kg)  SpO2: (!) 88%  BMI (Calculated): 59.06    Physical Exam Vitals reviewed.  Constitutional:      General: She is not in acute distress.    Appearance: She is obese.  HENT:     Head: Normocephalic.     Nose: Nose normal.     Mouth/Throat:     Mouth: Mucous membranes are moist.  Eyes:     Extraocular Movements: Extraocular movements intact.     Pupils: Pupils are equal, round, and reactive to light.  Cardiovascular:     Rate and Rhythm: Normal rate and regular rhythm.     Heart sounds: No murmur heard. Pulmonary:     Effort: Pulmonary effort is normal.     Breath sounds: No rhonchi or rales.  Abdominal:     General: Abdomen is flat.     Palpations: There is no hepatomegaly, splenomegaly or mass.  Musculoskeletal:        General: Normal range of motion.     Cervical back: Normal range of motion. No tenderness.  Skin:    General: Skin is warm and dry.  Neurological:     General: No focal deficit present.     Mental Status: She is alert and oriented to person, place, and time.     Cranial Nerves: No cranial nerve deficit.     Motor: No weakness.  Psychiatric:        Mood and Affect: Mood normal.        Behavior: Behavior normal.     No results found for any visits on 06/21/23.  No results found for this or any previous visit (from the past 2160 hours).    Assessment & Plan:  As per problem list, screen for prediabetes and sleep apnea. Problem List Items Addressed This Visit       Other   Morbid obesity with BMI of 50.0-59.9, adult (HCC) - Primary   Relevant Orders   Lipid panel   Hemoglobin A1c   Other Visit Diagnoses       Other specified anxiety disorders       Relevant Medications   cariprazine  (VRAYLAR ) 1.5 MG capsule     Essential (primary) hypertension       Relevant Medications   olmesartan -hydrochlorothiazide  (BENICAR  HCT) 40-25 MG  tablet   Other Relevant Orders   Comprehensive metabolic panel with GFR     Primary insomnia  Relevant Orders   Ambulatory referral to Sleep Studies       Return in about 3 months (around 09/21/2023).   Total time spent: 20 minutes  Arzella Bitters, MD  06/21/2023   This document may have been prepared by [facility] Voice Recognition software and as such may include unintentional dictation errors.

## 2023-06-24 ENCOUNTER — Other Ambulatory Visit: Payer: Self-pay | Admitting: Internal Medicine

## 2023-06-27 ENCOUNTER — Other Ambulatory Visit

## 2023-06-28 LAB — COMPREHENSIVE METABOLIC PANEL WITH GFR
ALT: 32 IU/L (ref 0–32)
AST: 26 IU/L (ref 0–40)
Albumin: 4 g/dL (ref 3.9–4.9)
Alkaline Phosphatase: 103 IU/L (ref 44–121)
BUN/Creatinine Ratio: 16 (ref 9–23)
BUN: 15 mg/dL (ref 6–24)
Bilirubin Total: <0.2 mg/dL (ref 0.0–1.2)
CO2: 21 mmol/L (ref 20–29)
Calcium: 9.4 mg/dL (ref 8.7–10.2)
Chloride: 105 mmol/L (ref 96–106)
Creatinine, Ser: 0.96 mg/dL (ref 0.57–1.00)
Globulin, Total: 2.5 g/dL (ref 1.5–4.5)
Glucose: 92 mg/dL (ref 70–99)
Potassium: 5.4 mmol/L — ABNORMAL HIGH (ref 3.5–5.2)
Sodium: 142 mmol/L (ref 134–144)
Total Protein: 6.5 g/dL (ref 6.0–8.5)
eGFR: 72 mL/min/{1.73_m2} (ref >59)

## 2023-06-28 LAB — LIPID PANEL
Chol/HDL Ratio: 2.9 ratio (ref 0.0–4.4)
Cholesterol, Total: 152 mg/dL (ref 100–199)
HDL: 52 mg/dL (ref >39)
LDL Chol Calc (NIH): 87 mg/dL (ref 0–99)
Triglycerides: 68 mg/dL (ref 0–149)
VLDL Cholesterol Cal: 13 mg/dL (ref 5–40)

## 2023-07-12 ENCOUNTER — Ambulatory Visit: Admitting: Internal Medicine

## 2023-07-12 ENCOUNTER — Encounter: Payer: Self-pay | Admitting: Internal Medicine

## 2023-07-12 VITALS — BP 120/90 | HR 102 | Temp 98.0°F | Ht 60.0 in | Wt 303.8 lb

## 2023-07-12 DIAGNOSIS — F32A Depression, unspecified: Secondary | ICD-10-CM

## 2023-07-12 DIAGNOSIS — Z6841 Body Mass Index (BMI) 40.0 and over, adult: Secondary | ICD-10-CM

## 2023-07-12 DIAGNOSIS — I10 Essential (primary) hypertension: Secondary | ICD-10-CM

## 2023-07-12 DIAGNOSIS — F411 Generalized anxiety disorder: Secondary | ICD-10-CM

## 2023-07-12 MED ORDER — CARVEDILOL PHOSPHATE ER 20 MG PO CP24: 20.0000 mg | ORAL_CAPSULE | Freq: Every day | ORAL | 2 refills | Status: DC

## 2023-07-12 NOTE — Progress Notes (Signed)
 Established Patient Office Visit  Subjective:  Patient ID: Deanna Vazquez, female    DOB: 1975-07-26  Age: 48 y.o. MRN: [id number]  Chief Complaint  Patient presents with   Follow-up    Blood work follow up    No new complaints, here for lab review and medication refills. Labs reviewed and notable for unremarkable cmp with normal fasting glucose. Mood has improved with Vraylar .    No other concerns at this time.   Past Medical History:  Diagnosis Date   Asthma    Chronic headache    Chronic knee pain    Hypertension     Past Surgical History:  Procedure Laterality Date   CESAREAN SECTION     CHOLECYSTECTOMY N/A 09/08/2022   Procedure: LAPAROSCOPIC CHOLECYSTECTOMY;  Surgeon: Dorena Gander, MD;  Location: [facility] OR;  Service: General;  Laterality: N/A;    Social History   Socioeconomic History   Marital status: Divorced    Spouse name: Not on file   Number of children: Not on file   Years of education: Not on file   Highest education level: Not on file  Occupational History   Not on file  Tobacco Use   Smoking status: Former    Current packs/day: 1.00    Types: Cigarettes   Smokeless tobacco: Never  Vaping Use   Vaping status: Never Used  Substance and Sexual Activity   Alcohol use: No   Drug use: No   Sexual activity: Not on file  Other Topics Concern   Not on file  Social History Narrative   Not on file   Social Drivers of Health   Financial Resource Strain: Not on file  Food Insecurity: No Food Insecurity (09/07/2022)   Hunger Vital Sign    Worried About Running Out of Food in the Last Year: Never true    Ran Out of Food in the Last Year: Never true  Transportation Needs: No Transportation Needs (09/07/2022)   PRAPARE - Administrator, Civil Service (Medical): No    Lack of Transportation (Non-Medical): No  Physical Activity: Not on file  Stress: Not on file  Social Connections: Not on file  Intimate Partner Violence: Not At Risk  (09/07/2022)   Humiliation, Afraid, Rape, and Kick questionnaire    Fear of Current or Ex-Partner: No    Emotionally Abused: No    Physically Abused: No    Sexually Abused: No    Family History  Problem Relation Age of Onset   Hypertension Mother    Diabetes Mother    Hyperlipidemia Mother    Cancer Mother    Stroke Mother    COPD Father     Allergies  Allergen Reactions   Penicillins Nausea And Vomiting    Outpatient Medications Prior to Visit  Medication Sig   gabapentin (NEURONTIN) 300 MG capsule Take 1 capsule by mouth 3 (three) times daily.   olmesartan -hydrochlorothiazide  (BENICAR  HCT) 40-25 MG tablet Take 1 tablet by mouth daily.   pantoprazole  (PROTONIX ) 40 MG tablet Take 1 tablet (40 mg total) by mouth daily.   traZODone  (DESYREL ) 100 MG tablet Take 1 tablet (100 mg total) by mouth at bedtime.   UBRELVY 50 MG TABS USE AS DIRECTED TAKE ONE DAILY AS NEEDED FOR MIGRAINE HEADACHES, REPEAT ONCE AFTER 2 HOURS IF HEADACHE PERSISTS   venlafaxine  XR (EFFEXOR -XR) 75 MG 24 hr capsule TAKE 3 CAPSULES BY MOUTH EVERY DAY   cariprazine  (VRAYLAR ) 1.5 MG capsule Take 1 capsule (1.5 mg  total) by mouth daily. (Patient not taking: Reported on 07/12/2023)   diclofenac (VOLTAREN) 75 MG EC tablet Take 75 mg by mouth 2 (two) times daily. (Patient not taking: Reported on 07/12/2023)   sucralfate  (CARAFATE ) 1 g tablet TAKE 1 TABLET BY MOUTH 4 TIMES A DAY (Patient not taking: Reported on 07/12/2023)   No facility-administered medications prior to visit.    Review of Systems  Constitutional:  Negative for weight loss.  Cardiovascular:  Negative for chest pain and palpitations.  All other systems reviewed and are negative.      Objective:   BP (!) 120/90   Pulse (!) 102   Temp 98 F (36.7 C)   Ht 5' (1.524 m)   Wt (!) 303 lb 12.8 oz (137.8 kg)   LMP 05/10/2023 (Approximate)   SpO2 97%   BMI 59.33 kg/m   Vitals:   07/12/23 1517  BP: (!) 120/90  Pulse: (!) 102  Temp: 98 F (36.7 C)   Height: 5' (1.524 m)  Weight: (!) 303 lb 12.8 oz (137.8 kg)  SpO2: 97%  BMI (Calculated): 59.33    Physical Exam Vitals reviewed.  Constitutional:      General: She is not in acute distress.    Appearance: She is obese.  HENT:     Head: Normocephalic.     Nose: Nose normal.     Mouth/Throat:     Mouth: Mucous membranes are moist.  Eyes:     Extraocular Movements: Extraocular movements intact.     Pupils: Pupils are equal, round, and reactive to light.  Cardiovascular:     Rate and Rhythm: Normal rate and regular rhythm.     Heart sounds: No murmur heard. Pulmonary:     Effort: Pulmonary effort is normal.     Breath sounds: No rhonchi or rales.  Abdominal:     General: Abdomen is flat.     Palpations: There is no hepatomegaly, splenomegaly or mass.  Musculoskeletal:        General: Normal range of motion.     Cervical back: Normal range of motion. No tenderness.  Skin:    General: Skin is warm and dry.  Neurological:     General: No focal deficit present.     Mental Status: She is alert and oriented to person, place, and time.     Cranial Nerves: No cranial nerve deficit.     Motor: No weakness.  Psychiatric:        Mood and Affect: Mood normal.        Behavior: Behavior normal.      No results found for any visits on 07/12/23.  Recent Results (from the past 2160 hours)  Comprehensive metabolic panel with GFR     Status: Abnormal   Collection Time: 06/27/23 10:59 AM  Result Value Ref Range   Glucose 92 70 - 99 mg/dL   BUN 15 6 - 24 mg/dL   Creatinine, Ser 1.61 0.57 - 1.00 mg/dL   eGFR 72 >09 UE/AVW/0.98   BUN/Creatinine Ratio 16 9 - 23   Sodium 142 134 - 144 mmol/L   Potassium 5.4 (H) 3.5 - 5.2 mmol/L   Chloride 105 96 - 106 mmol/L   CO2 21 20 - 29 mmol/L   Calcium 9.4 8.7 - 10.2 mg/dL   Total Protein 6.5 6.0 - 8.5 g/dL   Albumin 4.0 3.9 - 4.9 g/dL   Globulin, Total 2.5 1.5 - 4.5 g/dL   Bilirubin Total <1.1 0.0 - 1.2 mg/dL  Alkaline Phosphatase 103  44 - 121 IU/L   AST 26 0 - 40 IU/L   ALT 32 0 - 32 IU/L  Lipid panel     Status: None   Collection Time: 06/27/23 10:59 AM  Result Value Ref Range   Cholesterol, Total 152 100 - 199 mg/dL   Triglycerides 68 0 - 149 mg/dL   HDL 52 >16 mg/dL   VLDL Cholesterol Cal 13 5 - 40 mg/dL   LDL Chol Calc (NIH) 87 0 - 99 mg/dL   Chol/HDL Ratio 2.9 0.0 - 4.4 ratio    Comment:                                   T. Chol/HDL Ratio                                             Men  Women                               1/2 Avg.Risk  3.4    3.3                                   Avg.Risk  5.0    4.4                                2X Avg.Risk  9.6    7.1                                3X Avg.Risk 23.4   11.0       Assessment & Plan:  As per problem list. Add betablocker. Problem List Items Addressed This Visit       Other   GAD (generalized anxiety disorder)   Depression   Morbid obesity with BMI of 50.0-59.9, adult (HCC) - Primary   Relevant Orders   Ambulatory referral to General Surgery   Other Visit Diagnoses       Essential (primary) hypertension       Relevant Medications   carvedilol (COREG CR) 20 MG 24 hr capsule       Return in about 3 months (around 10/12/2023).   Total time spent: 20 minutes  Arzella Bitters, MD  07/12/2023   This document may have been prepared by [facility] Voice Recognition software and as such may include unintentional dictation errors.

## 2023-07-20 ENCOUNTER — Other Ambulatory Visit: Payer: Self-pay | Admitting: Internal Medicine

## 2023-07-20 DIAGNOSIS — F5101 Primary insomnia: Secondary | ICD-10-CM

## 2023-07-20 DIAGNOSIS — K922 Gastrointestinal hemorrhage, unspecified: Secondary | ICD-10-CM

## 2023-08-25 ENCOUNTER — Telehealth: Payer: Self-pay

## 2023-08-25 NOTE — Telephone Encounter (Signed)
 Pt is requesting to change her trazodone  dose to two tablets per night instead of one as she is still not sleeping at night. Pt is also a requesting an update on her sleep study referral as pt states she hasn't heard back yet. Please advise.

## 2023-08-31 ENCOUNTER — Other Ambulatory Visit: Payer: Self-pay | Admitting: Internal Medicine

## 2023-08-31 DIAGNOSIS — F5101 Primary insomnia: Secondary | ICD-10-CM

## 2023-08-31 MED ORDER — TRAZODONE HCL 100 MG PO TABS: 200.0000 mg | ORAL_TABLET | Freq: Every day | ORAL | 1 refills | Status: DC

## 2023-09-29 ENCOUNTER — Encounter: Payer: Self-pay | Admitting: Internal Medicine

## 2023-10-18 ENCOUNTER — Other Ambulatory Visit: Payer: Self-pay | Admitting: Internal Medicine

## 2023-10-18 DIAGNOSIS — F5101 Primary insomnia: Secondary | ICD-10-CM

## 2023-11-07 ENCOUNTER — Other Ambulatory Visit (HOSPITAL_COMMUNITY): Payer: Self-pay | Admitting: General Surgery

## 2023-11-07 DIAGNOSIS — R5383 Other fatigue: Secondary | ICD-10-CM

## 2023-11-09 ENCOUNTER — Other Ambulatory Visit: Payer: Self-pay | Admitting: Internal Medicine

## 2023-11-09 DIAGNOSIS — F5101 Primary insomnia: Secondary | ICD-10-CM

## 2023-11-13 ENCOUNTER — Other Ambulatory Visit: Payer: Self-pay | Admitting: Internal Medicine

## 2023-11-13 DIAGNOSIS — I10 Essential (primary) hypertension: Secondary | ICD-10-CM

## 2023-11-13 DIAGNOSIS — K922 Gastrointestinal hemorrhage, unspecified: Secondary | ICD-10-CM

## 2023-11-13 DIAGNOSIS — F5101 Primary insomnia: Secondary | ICD-10-CM

## 2023-11-21 ENCOUNTER — Ambulatory Visit (HOSPITAL_COMMUNITY)
Admission: RE | Admit: 2023-11-21 | Discharge: 2023-11-21 | Disposition: A | Discharge status: Home / Self Care | Source: Ambulatory Visit | Attending: General Surgery | Admitting: General Surgery

## 2023-11-21 ENCOUNTER — Other Ambulatory Visit (HOSPITAL_COMMUNITY): Payer: Self-pay | Admitting: General Surgery

## 2023-11-21 ENCOUNTER — Encounter (HOSPITAL_COMMUNITY)
Admission: RE | Admit: 2023-11-21 | Discharge: 2023-11-21 | Disposition: A | Source: Ambulatory Visit | Attending: General Surgery

## 2023-11-21 ENCOUNTER — Other Ambulatory Visit: Payer: Self-pay

## 2023-11-21 DIAGNOSIS — R5383 Other fatigue: Secondary | ICD-10-CM | POA: Insufficient documentation

## 2023-11-21 DIAGNOSIS — Z01818 Encounter for other preprocedural examination: Secondary | ICD-10-CM | POA: Insufficient documentation

## 2023-11-28 ENCOUNTER — Ambulatory Visit: Admitting: Internal Medicine

## 2023-12-09 ENCOUNTER — Ambulatory Visit: Admitting: Internal Medicine

## 2023-12-13 ENCOUNTER — Ambulatory Visit (HOSPITAL_COMMUNITY): Admitting: Licensed Clinical Social Worker

## 2023-12-13 DIAGNOSIS — F4323 Adjustment disorder with mixed anxiety and depressed mood: Secondary | ICD-10-CM
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
## 2023-12-21 ENCOUNTER — Ambulatory Visit: Admitting: Internal Medicine

## 2023-12-21 VITALS — BP 138/82 | HR 109 | Temp 97.8°F | Ht 60.0 in | Wt 306.6 lb

## 2023-12-21 DIAGNOSIS — F418 Other specified anxiety disorders: Secondary | ICD-10-CM

## 2023-12-21 DIAGNOSIS — I10 Essential (primary) hypertension: Secondary | ICD-10-CM

## 2023-12-21 DIAGNOSIS — K922 Gastrointestinal hemorrhage, unspecified: Secondary | ICD-10-CM | POA: Diagnosis not present

## 2023-12-21 DIAGNOSIS — F5101 Primary insomnia: Secondary | ICD-10-CM | POA: Diagnosis not present

## 2023-12-21 DIAGNOSIS — Z6841 Body Mass Index (BMI) 40.0 and over, adult: Secondary | ICD-10-CM

## 2023-12-21 MED ORDER — CARVEDILOL PHOSPHATE ER 20 MG PO CP24
20.0000 mg | ORAL_CAPSULE | Freq: Every day | ORAL | 0 refills | Status: AC
Start: 2023-12-21 — End: 2024-03-20

## 2023-12-21 MED ORDER — CARIPRAZINE HCL 1.5 MG PO CAPS: 1.5000 mg | ORAL_CAPSULE | Freq: Every day | ORAL | 0 refills | Status: DC

## 2023-12-21 MED ORDER — VENLAFAXINE HCL ER 75 MG PO CP24: 225.0000 mg | ORAL_CAPSULE | Freq: Every day | ORAL | 1 refills | Status: AC

## 2023-12-21 MED ORDER — PANTOPRAZOLE SODIUM 40 MG PO TBEC: 40.0000 mg | DELAYED_RELEASE_TABLET | Freq: Every day | ORAL | 0 refills | Status: AC

## 2023-12-21 MED ORDER — TRAZODONE HCL 100 MG PO TABS: 100.0000 mg | ORAL_TABLET | Freq: Every day | ORAL | 0 refills | Status: DC

## 2023-12-21 MED ORDER — OLMESARTAN MEDOXOMIL-HCTZ 40-25 MG PO TABS: 1.0000 | ORAL_TABLET | Freq: Every day | ORAL | 0 refills | Status: DC

## 2023-12-21 NOTE — Progress Notes (Signed)
 Established Patient Office Visit  Subjective:  Patient ID: Deanna Vazquez, female    DOB: 1975-06-11  Age: 48 y.o. MRN: [id number]  Chief Complaint  Patient presents with   Follow-up    Med refills    No new complaints, here for lab review and medication refills. Scheduled for bariatric surgery on 02/04/24.     No other concerns at this time.   Past Medical History:  Diagnosis Date   Asthma    Chronic headache    Chronic knee pain    Hypertension     Past Surgical History:  Procedure Laterality Date   CESAREAN SECTION     CHOLECYSTECTOMY N/A 09/08/2022   Procedure: LAPAROSCOPIC CHOLECYSTECTOMY;  Surgeon: Sebastian Moles, MD;  Location: [facility] OR;  Service: General;  Laterality: N/A;    Social History   Socioeconomic History   Marital status: Divorced    Spouse name: Not on file   Number of children: Not on file   Years of education: Not on file   Highest education level: Not on file  Occupational History   Not on file  Tobacco Use   Smoking status: Former    Current packs/day: 1.00    Types: Cigarettes   Smokeless tobacco: Never  Vaping Use   Vaping status: Never Used  Substance and Sexual Activity   Alcohol use: No   Drug use: No   Sexual activity: Not on file  Other Topics Concern   Not on file  Social History Narrative   Not on file   Social Drivers of Health   Financial Resource Strain: Not on file  Food Insecurity: No Food Insecurity (09/07/2022)   Hunger Vital Sign    Worried About Running Out of Food in the Last Year: Never true    Ran Out of Food in the Last Year: Never true  Transportation Needs: No Transportation Needs (09/07/2022)   PRAPARE - Administrator, Civil Service (Medical): No    Lack of Transportation (Non-Medical): No  Physical Activity: Not on file  Stress: Not on file  Social Connections: Not on file  Intimate Partner Violence: Not At Risk (09/07/2022)   Humiliation, Afraid, Rape, and Kick questionnaire    Fear  of Current or Ex-Partner: No    Emotionally Abused: No    Physically Abused: No    Sexually Abused: No    Family History  Problem Relation Age of Onset   Hypertension Mother    Diabetes Mother    Hyperlipidemia Mother    Cancer Mother    Stroke Mother    COPD Father     Allergies  Allergen Reactions   Penicillins Nausea And Vomiting    Outpatient Medications Prior to Visit  Medication Sig   diclofenac (VOLTAREN) 75 MG EC tablet Take 75 mg by mouth 2 (two) times daily.   UBRELVY 50 MG TABS USE AS DIRECTED TAKE ONE DAILY AS NEEDED FOR MIGRAINE HEADACHES, REPEAT ONCE AFTER 2 HOURS IF HEADACHE PERSISTS   [DISCONTINUED] cariprazine  (VRAYLAR ) 1.5 MG capsule Take 1 capsule (1.5 mg total) by mouth daily.   [DISCONTINUED] carvedilol  (COREG  CR) 20 MG 24 hr capsule Take 1 capsule (20 mg total) by mouth daily.   [DISCONTINUED] olmesartan -hydrochlorothiazide  (BENICAR  HCT) 40-25 MG tablet Take 1 tablet by mouth daily.   [DISCONTINUED] pantoprazole  (PROTONIX ) 40 MG tablet TAKE 1 TABLET BY MOUTH EVERY DAY   [DISCONTINUED] traZODone  (DESYREL ) 100 MG tablet TAKE 1 TABLET BY MOUTH EVERY DAY AT BEDTIME   [  DISCONTINUED] venlafaxine  XR (EFFEXOR -XR) 75 MG 24 hr capsule TAKE 3 CAPSULES BY MOUTH EVERY DAY   gabapentin (NEURONTIN) 300 MG capsule Take 1 capsule by mouth 3 (three) times daily. (Patient not taking: Reported on 12/21/2023)   sucralfate  (CARAFATE ) 1 g tablet TAKE 1 TABLET BY MOUTH 4 TIMES A DAY (Patient not taking: Reported on 12/21/2023)   No facility-administered medications prior to visit.    ROS     Objective:   BP 138/82   Pulse (!) 109   Temp 97.8 F (36.6 C)   Ht 5' (1.524 m)   Wt (!) 306 lb 9.6 oz (139.1 kg)   SpO2 97%   BMI 59.88 kg/m   Vitals:   12/21/23 0928  BP: 138/82  Pulse: (!) 109  Temp: 97.8 F (36.6 C)  Height: 5' (1.524 m)  Weight: (!) 306 lb 9.6 oz (139.1 kg)  SpO2: 97%  BMI (Calculated): 59.88    Physical Exam Vitals reviewed.  Constitutional:       General: Deanna Vazquez is not in acute distress.    Appearance: Deanna Vazquez is obese.  HENT:     Head: Normocephalic.     Nose: Nose normal.     Mouth/Throat:     Mouth: Mucous membranes are moist.  Eyes:     Extraocular Movements: Extraocular movements intact.     Pupils: Pupils are equal, round, and reactive to light.  Cardiovascular:     Rate and Rhythm: Normal rate and regular rhythm.     Heart sounds: No murmur heard. Pulmonary:     Effort: Pulmonary effort is normal.     Breath sounds: No rhonchi or rales.  Abdominal:     General: Abdomen is flat.     Palpations: There is no hepatomegaly, splenomegaly or mass.  Musculoskeletal:        General: Normal range of motion.     Cervical back: Normal range of motion. No tenderness.  Skin:    General: Skin is warm and dry.  Neurological:     General: No focal deficit present.     Mental Status: Deanna Vazquez is alert and oriented to person, place, and time.     Cranial Nerves: No cranial nerve deficit.     Motor: No weakness.  Psychiatric:        Mood and Affect: Mood normal.        Behavior: Behavior normal.      No results found for any visits on 12/21/23.  No results found for this or any previous visit (from the past 2160 hours).    Assessment & Plan:  Deanna Vazquez was seen today for follow-up.  Morbid obesity with BMI of 50.0-59.9, adult (HCC)  Gastrointestinal hemorrhage, unspecified gastrointestinal hemorrhage type -     Pantoprazole  Sodium; Take 1 tablet (40 mg total) by mouth daily.  Dispense: 90 tablet; Refill: 0  Primary insomnia -     traZODone  HCl; Take 1 tablet (100 mg total) by mouth at bedtime.  Dispense: 90 tablet; Refill: 0  Essential (primary) hypertension -     Carvedilol  Phosphate ER; Take 1 capsule (20 mg total) by mouth daily.  Dispense: 90 capsule; Refill: 0 -     Olmesartan  Medoxomil-HCTZ; Take 1 tablet by mouth daily.  Dispense: 90 tablet; Refill: 0  Other specified anxiety disorders -     Venlafaxine  HCl ER; Take  3 capsules (225 mg total) by mouth daily.  Dispense: 270 capsule; Refill: 1 -     Cariprazine  HCl; Take 1  capsule (1.5 mg total) by mouth daily.  Dispense: 90 capsule; Refill: 0    Problem List Items Addressed This Visit       Cardiovascular and Mediastinum   Essential (primary) hypertension   Relevant Medications   carvedilol  (COREG  CR) 20 MG 24 hr capsule   olmesartan -hydrochlorothiazide  (BENICAR  HCT) 40-25 MG tablet     Other   Morbid obesity with BMI of 50.0-59.9, adult (HCC) - Primary   Other Visit Diagnoses       Gastrointestinal hemorrhage, unspecified gastrointestinal hemorrhage type       Relevant Medications   pantoprazole  (PROTONIX ) 40 MG tablet     Primary insomnia       Relevant Medications   traZODone  (DESYREL ) 100 MG tablet     Other specified anxiety disorders       Relevant Medications   traZODone  (DESYREL ) 100 MG tablet   venlafaxine  XR (EFFEXOR -XR) 75 MG 24 hr capsule   cariprazine  (VRAYLAR ) 1.5 MG capsule       Return in about 3 months (around 03/22/2024).   Total time spent: 20 minutes. This time includes review of previous notes and results and patient face to face interaction during today'Notnamed Scholz visit.    Sherrill Cinderella Perry, MD  12/21/2023   This document may have been prepared by [facility] Voice Recognition software and as such may include unintentional dictation errors.

## 2024-01-12 ENCOUNTER — Encounter: Attending: General Surgery | Admitting: Dietician

## 2024-01-12 ENCOUNTER — Encounter: Payer: Self-pay | Admitting: Dietician

## 2024-01-12 VITALS — Ht 60.0 in | Wt 319.1 lb

## 2024-01-12 DIAGNOSIS — E669 Obesity, unspecified: Secondary | ICD-10-CM | POA: Insufficient documentation

## 2024-01-12 NOTE — Progress Notes (Unsigned)
 Nutrition Assessment for Bariatric Surgery: Pre-Surgery Behavioral and Nutrition Intervention Program   Medical Nutrition Therapy  Appt Start Time: 1510    End Time: 1623  Patient was seen on 01/12/2024 for Pre-Operative Nutrition Assessment. Purpose of todays visit  enhance perioperative outcomes along with a healthy weight maintenance   Referral stated Supervised Weight Loss (SWL) visits needed: 3 months  Planned surgery: Sleeve Gastrectomy Pt expectation of surgery: health reason, knee pain relief, surgery to help her to feel more accountable  NUTRITION ASSESSMENT   Anthropometrics  Start weight at [facility]: 319.1 lbs (date: 01/12/2024)  Height: 60 in BMI: 62.32 kg/m2    Clinical   Pharmacotherapy: History of weight loss medication used: diuretics  Medical hx: asthma, HTN, obesity, sleep apnea Medications: Effexor , Protonix , vraylar , didofenax, tramadol  Labs: vit b12 >2000, Vit D 23.1, iron saturation 13, red blood cells 5.38, white blood cells 11.5,  Notable signs/symptoms: walking with a cane due to knee pain Any previous deficiencies? No  Evaluation of Nutritional Deficiencies: Micronutrient Nutrition Focused Physical Exam: Hair: No issues observed Eyes: No issues observed Mouth: No issues observed Neck: No issues observed Nails: No issues observed Skin: No issues observed  Lifestyle & Dietary Hx Pt arrived with her adult daughter.  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  Current Physical Activity Recommendations state 150 minutes per week of moderate to vigorous movement including Cardio and 1-2 days of resistance activities as well as flexibility/balance activities:  Pts current physical activity: ADLs, with 0% recommendation reached   Sleep Hygiene: duration and quality: pt states she is not using a CPAP right now, stating she had new sleep study done and is pursuing a CPAP machine  Current  Patient Perceived Stress Level as stated by pt on a scale of 1-10: 8       Stress Management Techniques: quietness, time out  According to the Dietary Guidelines for Americans Recommendation: equivalent 1.5-2 cups fruits per day, equivalent 2-3 cups vegetables per day and at least half all grains whole  Fruit servings per day (on average): 1, meeting 50% recommendation  Non-starchy vegetable servings per day (on average): 2-3, meeting 100% recommendation  Whole Grains per day (on average): 0-1  Number of meals missed/skipped per week out of 21: 7-14  24-Hr Dietary Recall First Meal: grits and bacon or fruit or skip Snack:  Second Meal: skip or burger Snack:  Third Meal: skip or fried pork chop or pork chop sandwich or chili or pizza or spaghetti Snack:  Beverages: sprite, water  Alcoholic beverages per week: 0   Estimated Energy Needs Calories: 1500  NUTRITION DIAGNOSIS  Overweight/obesity ([place]-3.3) related to past poor dietary habits and physical inactivity as evidenced by patient w/ planned sleeve surgery following dietary guidelines for continued weight loss.  NUTRITION INTERVENTION  Nutrition counseling (C-1) and education (E-2) to facilitate bariatric surgery goals.  Educated pt on micronutrient deficiencies post-surgery and behavioral/dietary strategies to start in order to mitigate that risk   Behavioral and Dietary Interventions Pre-Op Goals Reviewed with the Patient Nutrition: Healthy Eating Behaviors Switch to non-caloric, non-carbonated and non-caffeinated beverages such as  water, unsweetened tea, Eliannah Light and zero calorie beverages (aim for 64 oz. per day) Cut out grazing between meals or at night  Find a protein shake you like Eat every 3-5 hours        Eliminate distractions while eating (TV, computer, reading, driving, texting)  Take 20-30 minutes to eat a meal  Decrease high sugar foods/decrease high fat/fried foods Eliminate alcoholic beverages Increase  protein intake (eggs, fish, chicken, yogurt) before surgery Eat non starchy vegetables 2 times a day 7 days a week Eat complex carbohydrates such as whole grains and fruits   Behavioral Modification: Physical Activity Increase my usual daily activity (use stairs, park farther, etc.) Engage in _______________________  activity  _______ minutes ______ times per week  Other:    *Goals that are bolded indicate the pt would like to start working towards these  Handouts Provided Include  Bariatric Surgery handouts (Nutrition Visits, Pre Surgery Behavioral Change Goals, Protein Shakes Brands to Choose From, Vitamins & Mineral Supplementation)  Learning Style & Readiness for Change Teaching method utilized: Visual, Auditory, and hands on  Demonstrated degree of understanding via: Teach Back  Readiness Level: preparation Barriers to learning/adherence to lifestyle change: mobility/ knee pain  RD's Notes for Next Visit Patient progress towards chosen goals   MONITORING & EVALUATION Dietary intake, weekly physical activity, body weight, and preoperative behavioral change goals   Next Steps  Patient is to follow up at [facility] in 2-3 weeks for first SWL visit.

## 2024-01-23 ENCOUNTER — Other Ambulatory Visit: Payer: Self-pay

## 2024-01-23 DIAGNOSIS — F5101 Primary insomnia: Secondary | ICD-10-CM

## 2024-01-23 MED ORDER — TRAZODONE HCL 100 MG PO TABS: 100.0000 mg | ORAL_TABLET | Freq: Every day | ORAL | 0 refills | Status: AC

## 2024-01-26 ENCOUNTER — Encounter: Attending: General Surgery | Admitting: Dietician

## 2024-01-26 VITALS — Ht 60.0 in | Wt 321.1 lb

## 2024-01-26 DIAGNOSIS — E669 Obesity, unspecified: Secondary | ICD-10-CM | POA: Insufficient documentation

## 2024-01-26 NOTE — Progress Notes (Signed)
 Supervised Weight Loss Visit Bariatric Nutrition Education Appt Start Time: 0915    End Time: 0943  Planned surgery: Sleeve Gastrectomy Pt expectation of surgery: health reason, knee pain relief, surgery to help her to feel more accountable  1 out of 3 SWL Appointments   Referral stated Supervised Weight Loss (SWL) visits needed: 3 months  NUTRITION ASSESSMENT   Anthropometrics  Start weight at [facility]: 319.1 lbs (date: 01/12/2024)  Height: 60 in Weight today: 321.1 lbs BMI: 62.71 kg/m2    Clinical   Pharmacotherapy: History of weight loss medication used: diuretics  Medical hx: asthma, HTN, obesity, sleep apnea Medications: Effexor , Protonix , vraylar , didofenax, tramadol  Labs: vit b12 >2000, Vit D 23.1, iron saturation 13, red blood cells 5.38, white blood cells 11.5,  Notable signs/symptoms: walking with a cane due to knee pain Any previous deficiencies? No  Lifestyle & Dietary Hx Pt states she has cut back on Sprite, stating she has only had 4 since last visit. Pt states she has slowed down, stating she is feeling satisfied before she finishes, stating she does not finish all of her food. Pt states she skips meals all the time.  Estimated daily fluid intake: 64 oz (5-6  16 oz water bottles) Supplements: n/a (pt agreeable to take Vit D3, stating she has some at home) Current average weekly physical activity: ADLs  24-Hr Dietary Recall First Meal: grits and bacon or fruit or skip Snack:  Second Meal: skip or burger Snack:  Third Meal: skip or fried pork chop or pork chop sandwich or chili or pizza or spaghetti Snack:  Beverages: sprite, water  Alcoholic beverages per week: 0   Estimated Energy Needs Calories: 1500  NUTRITION DIAGNOSIS  Overweight/obesity ([place]-3.3) related to past poor dietary habits and physical inactivity as evidenced by patient w/ planned sleeve surgery following dietary guidelines for continued weight loss.  NUTRITION INTERVENTION  Nutrition  counseling (C-1) and education (E-2) to facilitate bariatric surgery goals.  When preparing for bariatric surgery, its important to avoid skipping meals so your body receives steady nutrition and energy throughout the day. Each meal or snack should include a source of protein, which supports healing, muscle preservation, and helps you feel satisfied longer. Using the Bariatric MyPlate method as a guide makes meal planning simple: prioritize protein first, then add non-starchy vegetables, and finally include small portions of complex carbohydrates. This balanced approach not only ensures you meet your protein goals but also helps you avoid excessive hunger, making it easier to manage portions and maintain healthy eating habits long term.  Pre-Op Goals Progress & New Goals Switch to non-caloric, non-carbonated and non-caffeinated beverages such as  water, unsweetened tea, Elayah Light and zero calorie beverages (aim for 64 oz. per day) Take 20-30 minutes to eat a meal New: start taking vit D3 New: avoid skipping meals; aim for a protein with every meal or snack; use the bariatric MyPlate method as a guide  Handouts Provided Include  Bariatric MyPlate Meal Ideas  Learning Style & Readiness for Change Teaching method utilized: Visual & Auditory  Demonstrated degree of understanding via: Teach Back  Readiness Level: preparation Barriers to learning/adherence to lifestyle change: mobility/ knee pain  RD's Notes for next Visit  Patient progress toward chosen goals  MONITORING & EVALUATION Dietary intake, weekly physical activity, body weight, and pre-op goals.  Next Steps  Patient is to return to [facility] in 1 month for for next SWL visit.

## 2024-01-30 ENCOUNTER — Other Ambulatory Visit: Payer: Self-pay | Admitting: Internal Medicine

## 2024-01-30 DIAGNOSIS — F418 Other specified anxiety disorders: Secondary | ICD-10-CM

## 2024-02-10 ENCOUNTER — Other Ambulatory Visit: Payer: Self-pay | Admitting: Internal Medicine

## 2024-02-10 DIAGNOSIS — F418 Other specified anxiety disorders: Secondary | ICD-10-CM

## 2024-02-14 ENCOUNTER — Other Ambulatory Visit: Payer: Self-pay | Admitting: Internal Medicine

## 2024-02-14 DIAGNOSIS — F418 Other specified anxiety disorders: Secondary | ICD-10-CM

## 2024-02-19 ENCOUNTER — Other Ambulatory Visit: Payer: Self-pay | Admitting: Internal Medicine

## 2024-02-19 DIAGNOSIS — I10 Essential (primary) hypertension: Secondary | ICD-10-CM

## 2024-02-20 ENCOUNTER — Other Ambulatory Visit: Payer: Self-pay | Admitting: Internal Medicine

## 2024-02-20 ENCOUNTER — Other Ambulatory Visit: Payer: Self-pay

## 2024-02-20 DIAGNOSIS — F418 Other specified anxiety disorders: Secondary | ICD-10-CM

## 2024-02-20 MED ORDER — CARIPRAZINE HCL 1.5 MG PO CAPS: 1.5000 mg | ORAL_CAPSULE | Freq: Every day | ORAL | 0 refills | Status: AC

## 2024-02-23 ENCOUNTER — Encounter: Payer: Self-pay | Admitting: Dietician

## 2024-02-23 ENCOUNTER — Encounter: Attending: General Surgery | Admitting: Dietician

## 2024-02-23 VITALS — Ht 60.0 in | Wt 318.3 lb

## 2024-02-23 DIAGNOSIS — E669 Obesity, unspecified: Secondary | ICD-10-CM | POA: Diagnosis present

## 2024-02-23 NOTE — Progress Notes (Signed)
 Supervised Weight Loss Visit Bariatric Nutrition Education Appt Start Time: [phone]    End Time: 0915  Planned surgery: Sleeve Gastrectomy Pt expectation of surgery: health reason, knee pain relief, surgery to help her to feel more accountable  2 out of 3 SWL Appointments   Referral stated Supervised Weight Loss (SWL) visits needed: 3 months  NUTRITION ASSESSMENT   Anthropometrics  Start weight at [facility]: 319.1 lbs (date: 01/12/2024)  Height: 60 in Weight today: 318.3 lbs BMI: 62.16 kg/m2    Clinical   Pharmacotherapy: History of weight loss medication used: diuretics  Medical hx: asthma, HTN, obesity, sleep apnea Medications: Effexor , Protonix , vraylar , didofenax, tramadol  Labs: vit b12 >2000, Vit D 23.1, iron saturation 13, red blood cells 5.38, white blood cells 11.5,  Notable signs/symptoms: walking with a cane due to knee pain Any previous deficiencies? No  Lifestyle & Dietary Hx  Pt states she has not been drinking any Sprite, stating water and un-sweet tea (with lemon). Pt states she has not had any sweet, except fireball candy. Pt states she has already started practicing not drinking with meals. Pt states she has been trying to eat every 4 hours, stating she is trying to stay away from fried foods. Pt is agreeable to track her protein.  Estimated daily fluid intake: 64 oz (5-6  16 oz water bottles) Supplements: n/a (pt agreeable to take Vit D3, stating she has some at home) Current average weekly physical activity: ADLs  24-Hr Dietary Recall First Meal: grits and bacon and eggs or fruit or skip Snack: peanut butter crackers (prepped at home) Second Meal: baked chicken or fresh salad (grilled chicken salad) Snack:  Third Meal: taco with no shell (taco salad) Snack:  Beverages: water, un-sweet tea  Alcoholic beverages per week: 0   Estimated Energy Needs Calories: 1500  NUTRITION DIAGNOSIS  Overweight/obesity ([place]-3.3) related to past poor dietary habits and  physical inactivity as evidenced by patient w/ planned sleeve surgery following dietary guidelines for continued weight loss.  NUTRITION INTERVENTION  Nutrition counseling (C-1) and education (E-2) to facilitate bariatric surgery goals.  Aiming for 60 grams of protein per day becomes much easier when you build it into every meal and snack. Centering your choices around the bariatric MyPlate keeps portions balanced and supports steady nutrition: half of your plate is protein, the majority of the remaining half is non-starchy vegetables, and the final small section comes from complex carbohydrates like whole grains, fruit, or starchy vegetables. Including a protein source each time you eat helps you stay satisfied, maintain muscle, and meet your daily target without relying on large portions. This structure creates a simple, repeatable routine that supports long-term success after surgery.  Pre-Op Goals Progress & New Goals Switch to non-caloric, non-carbonated and non-caffeinated beverages such as  water, unsweetened tea, [place] Light and zero calorie beverages (aim for 64 oz. per day) Take 20-30 minutes to eat a meal Re-engage: start taking vit D3 Continue: avoid skipping meals; aim for a protein with every meal or snack; use the bariatric MyPlate method as a guide New: track protein; aim for 60 grams per day  Handouts Provided Include  Bariatric MyPlate  Learning Style & Readiness for Change Teaching method utilized: Visual & Auditory  Demonstrated degree of understanding via: Teach Back  Readiness Level: preparation Barriers to learning/adherence to lifestyle change: mobility/ knee pain  RD's Notes for next Visit  Patient progress toward chosen goals  MONITORING & EVALUATION Dietary intake, weekly physical activity, body weight, and pre-op  goals.  Next Steps  Patient is to return to [facility] in 1 month for for next SWL visit.

## 2024-03-23 ENCOUNTER — Encounter: Admitting: Dietician
